# Patient Record
Sex: Male | Born: 1965 | Race: White | Hispanic: No | Marital: Single | Smoking: Former smoker
Health system: Southern US, Community
[De-identification: ages and names within clinical notes are randomized; demographics above are authoritative.]

## PROBLEM LIST (undated history)

## (undated) DIAGNOSIS — M549 Dorsalgia, unspecified: Secondary | ICD-10-CM

## (undated) DIAGNOSIS — E785 Hyperlipidemia, unspecified: Secondary | ICD-10-CM

## (undated) DIAGNOSIS — K449 Diaphragmatic hernia without obstruction or gangrene: Secondary | ICD-10-CM

## (undated) DIAGNOSIS — K219 Gastro-esophageal reflux disease without esophagitis: Secondary | ICD-10-CM

## (undated) DIAGNOSIS — E119 Type 2 diabetes mellitus without complications: Secondary | ICD-10-CM

## (undated) DIAGNOSIS — I10 Essential (primary) hypertension: Secondary | ICD-10-CM

## (undated) DIAGNOSIS — R0789 Other chest pain: Secondary | ICD-10-CM

## (undated) HISTORY — DX: Other chest pain: R07.89

## (undated) HISTORY — DX: Diaphragmatic hernia without obstruction or gangrene: K44.9

## (undated) HISTORY — DX: Hyperlipidemia, unspecified: E78.5

## (undated) HISTORY — DX: Essential (primary) hypertension: I10

## (undated) HISTORY — DX: Type 2 diabetes mellitus without complications: E11.9

## (undated) HISTORY — DX: Gastro-esophageal reflux disease without esophagitis: K21.9

## (undated) HISTORY — DX: Dorsalgia, unspecified: M54.9

---

## 2000-06-17 ENCOUNTER — Encounter: Payer: Self-pay | Admitting: Cardiothoracic Surgery

## 2000-06-17 ENCOUNTER — Encounter: Admission: RE | Admit: 2000-06-17 | Discharge: 2000-06-17 | Payer: Self-pay | Admitting: Cardiothoracic Surgery

## 2001-07-27 ENCOUNTER — Encounter: Payer: Self-pay | Admitting: Family Medicine

## 2001-07-27 ENCOUNTER — Ambulatory Visit (HOSPITAL_COMMUNITY): Admission: RE | Admit: 2001-07-27 | Discharge: 2001-07-27 | Payer: Self-pay | Admitting: Family Medicine

## 2002-09-20 ENCOUNTER — Encounter: Payer: Self-pay | Admitting: Family Medicine

## 2002-09-20 ENCOUNTER — Ambulatory Visit (HOSPITAL_COMMUNITY): Admission: RE | Admit: 2002-09-20 | Discharge: 2002-09-20 | Payer: Self-pay | Admitting: Family Medicine

## 2003-10-23 ENCOUNTER — Ambulatory Visit (HOSPITAL_COMMUNITY): Admission: RE | Admit: 2003-10-23 | Discharge: 2003-10-23 | Payer: Self-pay | Admitting: Family Medicine

## 2004-11-03 ENCOUNTER — Ambulatory Visit (HOSPITAL_COMMUNITY): Admission: RE | Admit: 2004-11-03 | Discharge: 2004-11-03 | Payer: Self-pay | Admitting: Family Medicine

## 2005-09-20 ENCOUNTER — Ambulatory Visit: Admission: RE | Admit: 2005-09-20 | Discharge: 2005-09-20 | Payer: Self-pay | Admitting: *Deleted

## 2005-09-30 ENCOUNTER — Ambulatory Visit: Payer: Self-pay | Admitting: Internal Medicine

## 2005-10-14 ENCOUNTER — Ambulatory Visit: Payer: Self-pay | Admitting: Pulmonary Disease

## 2005-11-03 ENCOUNTER — Ambulatory Visit (HOSPITAL_COMMUNITY): Admission: RE | Admit: 2005-11-03 | Discharge: 2005-11-03 | Payer: Self-pay | Admitting: Internal Medicine

## 2005-11-03 ENCOUNTER — Ambulatory Visit: Payer: Self-pay | Admitting: Internal Medicine

## 2005-11-03 HISTORY — PX: COLONOSCOPY: SHX174

## 2006-01-04 ENCOUNTER — Ambulatory Visit (HOSPITAL_COMMUNITY): Admission: RE | Admit: 2006-01-04 | Discharge: 2006-01-04 | Payer: Self-pay | Admitting: Family Medicine

## 2008-04-22 ENCOUNTER — Ambulatory Visit (HOSPITAL_COMMUNITY): Admission: RE | Admit: 2008-04-22 | Discharge: 2008-04-22 | Payer: Self-pay | Admitting: Family Medicine

## 2008-09-26 ENCOUNTER — Ambulatory Visit (HOSPITAL_COMMUNITY): Admission: RE | Admit: 2008-09-26 | Discharge: 2008-09-26 | Payer: Self-pay | Admitting: Family Medicine

## 2009-02-26 ENCOUNTER — Ambulatory Visit (HOSPITAL_COMMUNITY): Admission: RE | Admit: 2009-02-26 | Discharge: 2009-02-26 | Payer: Self-pay | Admitting: Family Medicine

## 2009-12-25 ENCOUNTER — Inpatient Hospital Stay (HOSPITAL_COMMUNITY): Admission: EM | Admit: 2009-12-25 | Discharge: 2009-12-26 | Payer: Self-pay | Admitting: Emergency Medicine

## 2009-12-27 ENCOUNTER — Emergency Department (HOSPITAL_COMMUNITY): Admission: EM | Admit: 2009-12-27 | Discharge: 2009-12-27 | Payer: Self-pay | Admitting: Emergency Medicine

## 2010-01-30 HISTORY — PX: US ECHOCARDIOGRAPHY: HXRAD669

## 2010-01-30 HISTORY — PX: NM MYOCAR PERF WALL MOTION: HXRAD629

## 2010-07-24 ENCOUNTER — Ambulatory Visit (HOSPITAL_COMMUNITY): Admission: RE | Admit: 2010-07-24 | Discharge: 2010-07-24 | Payer: Self-pay | Admitting: Family Medicine

## 2011-01-17 ENCOUNTER — Encounter: Payer: Self-pay | Admitting: Family Medicine

## 2011-03-29 LAB — DIFFERENTIAL
Basophils Relative: 1 % (ref 0–1)
Basophils Relative: 1 % (ref 0–1)
Eosinophils Absolute: 0.1 10*3/uL (ref 0.0–0.7)
Eosinophils Relative: 1 % (ref 0–5)
Lymphocytes Relative: 14 % (ref 12–46)
Lymphocytes Relative: 20 % (ref 12–46)
Lymphs Abs: 1.7 10*3/uL (ref 0.7–4.0)
Lymphs Abs: 2 10*3/uL (ref 0.7–4.0)
Monocytes Relative: 5 % (ref 3–12)
Neutro Abs: 7.4 10*3/uL (ref 1.7–7.7)
Neutrophils Relative %: 72 % (ref 43–77)
Neutrophils Relative %: 80 % — ABNORMAL HIGH (ref 43–77)

## 2011-03-29 LAB — BASIC METABOLIC PANEL
BUN: 9 mg/dL (ref 6–23)
CO2: 26 mEq/L (ref 19–32)
Calcium: 9.7 mg/dL (ref 8.4–10.5)
Chloride: 103 mEq/L (ref 96–112)
Chloride: 98 mEq/L (ref 96–112)
Creatinine, Ser: 0.71 mg/dL (ref 0.4–1.5)
GFR calc Af Amer: >60 mL/min (ref >60)
GFR calc Af Amer: >60 mL/min (ref >60)
GFR calc non Af Amer: >60 mL/min (ref >60)
Glucose, Bld: 137 mg/dL — ABNORMAL HIGH (ref 70–99)
Potassium: 4 mEq/L (ref 3.5–5.1)
Sodium: 136 mEq/L (ref 135–145)
Sodium: 137 mEq/L (ref 135–145)

## 2011-03-29 LAB — POCT CARDIAC MARKERS
CKMB, poc: 1.1 ng/mL (ref 1.0–8.0)
Myoglobin, poc: 62.5 ng/mL (ref 12–200)
Troponin i, poc: <0.05 ng/mL (ref 0.00–0.09)

## 2011-03-29 LAB — CARDIAC PANEL(CRET KIN+CKTOT+MB+TROPI)
Total CK: 132 U/L (ref 7–232)
Troponin I: 0.03 ng/mL (ref 0.00–0.06)

## 2011-03-29 LAB — CBC
HCT: 44.1 % (ref 39.0–52.0)
MCHC: 34.4 g/dL (ref 30.0–36.0)
MCV: 89.6 fL (ref 78.0–100.0)
RBC: 4.92 MIL/uL (ref 4.22–5.81)
WBC: 10.2 10*3/uL (ref 4.0–10.5)

## 2011-03-29 LAB — TSH: TSH: 1.506 u[IU]/mL (ref 0.350–4.500)

## 2011-05-14 NOTE — Op Note (Signed)
NAMEDIGBY, GROENEVELD               ACCOUNT NO.:  [id number]   MEDICAL RECORD NO.:  [id number]          PATIENT TYPE:  AMB   LOCATION:  DAY                           FACILITY:  [facility]   PHYSICIAN:  R. Roetta Sessions, M.D. DATE OF BIRTH:  [birth year]   DATE OF PROCEDURE:  11/03/2005  DATE OF DISCHARGE:                                 OPERATIVE REPORT   PROCEDURE:  Colonoscopy with ileoscopy, stool sampling.   INDICATIONS FOR PROCEDURE:  The patient is a 45 year old Caucasian male with  intermittent post prandial abdominal cramps who had diarrhea for the past  four months. He developed what sounds like a food-borne illness some four  months ago. He has had persisting symptoms. He has two second degree  relatives with colorectal carcinoma but no first degree relatives. He has  not passed any ______________. Colonoscopy is now being done. This approach  has been discussed with the patient at length. Potential risks, benefits,  and alternatives have been reviewed and questions answered. She is  agreeable. Please see documentation in the medical record.   PROCEDURE NOTE:  O2 saturation, blood pressure, pulse, and respirations were  monitored throughout the entire procedure. Conscious sedation with Versed 4  mg IV and Demerol 75 mg IV in divided doses.   INSTRUMENT:  Olympus video chip system.   FINDINGS:  Digital rectal exam revealed no abnormalities.   ENDOSCOPIC FINDINGS:  Prep was adequate.   Rectum:  Examination of the rectal mucosa including retroflexed view of the  anal verge revealed no abnormalities.   Colon:  Colonic mucosa was surveyed from the rectosigmoid junction through  the left, transverse, and right colon to the area of the appendiceal  orifice, ileocecal valve, and cecum. These structures were well seen and  photographed for the record. Terminal ileum was intubated to 10 cm. From  this level, the scope was slowly withdrawn, and all previously mentioned  mucosal surfaces  were again seen. The terminal ileal and colonic mucosa  appeared entirely normal. Stool sample was suctioned out for microbiology  studies. The patient tolerated the procedure well and was reactive to  endoscopy.   IMPRESSION:  Normal rectum, colon, terminal ileum.   I suspect the patient has irritable bowel syndrome, possibly exacerbated or  unmasked with a recent food-borne illness.   RECOMMENDATIONS:  1.  IBS literature provided to [name].  2.  NuLev 1 tablet on tongue before meals as needed.  3.  Follow up on stool studies.  4.  Further recommendations to follow.      Stephen Alexander, M.D.  Electronically Signed     RMR/MEDQ  D:  11/03/2005  T:  11/03/2005  Job:  [id number]   cc:   Madelin Rear. Sherwood Gambler, MD  Fax: [phone]

## 2011-05-14 NOTE — H&P (Signed)
NAMECORDELLE, DAHMEN               ACCOUNT NO.:  [id number]   MEDICAL RECORD NO.:  [id number]          PATIENT TYPE:  AMB   LOCATION:  DAY                           FACILITY:  [facility]   PHYSICIAN:  R. Roetta Sessions, M.D. DATE OF BIRTH:  1966-02-05   DATE OF ADMISSION:  DATE OF DISCHARGE:  LH                                HISTORY & PHYSICAL   CHIEF COMPLAINT:  Altered bowel habits X3 months.  Positive family history  of colon cancer.   HISTORY OF PRESENT ILLNESS:  Mr. Conner Muegge is a 45 year old Caucasian  male who came to see me to discuss further his abdominal cramps, need for  colonoscopy.   He says he developed acute gastroenteritis which was characterized by  nausea, vomiting, diarrhea, abdominal pain back in July.  He saw Dr. Sherwood Gambler  who felt he had a food-borne illness and was Hemoccult negative per patient  report at that time.  He has had intermittent postprandial abdominal cramps  and diarrhea every since. He occasionally has a formed bowel movement. No  blood per rectum or melena.  He has not lost any weight.  He is concerned by  the fact that two maternal uncles and his maternal grandmother had  colorectal cancer.  He has no first degree relatives with colorectal cancer.   I saw this gentleman back in 1999 for typical gastroesophageal reflux  disease and episode of rectal bleeding.  He underwent an  esophagogastroduodenoscopy and colonoscopy back on April 01, 1998 which  demonstrated no upper gastrointestinal tract abnormalities.  His  sigmoidoscopy was carried out to 50 cm and demonstrated nothing more than  anal canal hemorrhoids.  There is no family history of inflammatory bowel  disease. He has had some atypical chest pain over the past couple of months  which has gotten better over the past one month. He has seen Dr. Domingo Sep  and evaluation has included a stress test, echocardiogram. He had a sleep  test recently and was told he may have sleep apnea.  He also  reports picking  up four stray cats recently, [place] and they had  parasites.   PAST MEDICAL HISTORY:  Chronic discogenic back pain.  Hypertension.  History  of atypical chest pain as above.   PAST SURGICAL HISTORY:  No prior surgeries, aside from endoscopic evaluation  as outlined above.   CURRENT MEDICATIONS:  1.  Lorcet Plus 7.5/650 mg once daily.  2.  Flexeril 10 mg daily.  3.  Tylenol PM PRN.  4.  Zantac 150 mg three to four times weekly as a preventative measure for      gastroesophageal reflux disease.   ALLERGIES:  No known drug allergies.   FAMILY HISTORY:  As outlined above.  Father died with aspiration pneumonia  and stroke last year.  Mother is alive in fair health.   SOCIAL HISTORY:  Patient is single. He has no children.  He is employed with  [employer].  He makes [employer].  He was a former smoker but none  since 2001.  Prior heavy use but  none since 1998.  No illicit drug use.   REVIEW OF SYSTEMS:  As in history of present illness.  He has rare typical  reflux symptoms.  No odynophagia, dysphagia, early satiety, nausea,  vomiting, no melena or rectal bleeding.   PHYSICAL EXAMINATION:  GENERAL APPEARANCE:  Physical examination reveals a  pleasant 45 year old gentleman resting comfort.  VITAL SIGNS:  Weight 261.5, height 5 feet 10 inches, temperature 97.5, blood  pressure 134/80, pulse 88.  SKIN:  Warm and dry.  HEENT:  No scleral icterus.  Conjunctiva pink.  Oral cavity with no lesions.  NECK:  Jugular venous distention is not prominent.  CHEST:  Lungs are clear to auscultation.  CARDIAC:  Regular rate and rhythm without murmurs, rubs or gallops.  ABDOMEN:  Obese, positive bowel sounds, soft, nontender without appreciable  mass or organomegaly.   ASSESSMENT:  Mr. Oshae Simmering is a 45 year old gentleman with positive  family history of colon cancer in multiple second degree relatives who has  had intermittent postprandial abdominal  cramps and diarrhea since suffering  what sounds like an acute bout of gastroenteritis back some three months  ago.  He is quite concerned about colorectal cancer given his family  history.  We had a lengthy discussion about this.  He wants to have his  colon examined.  This is not unreasonable given he does have altered bowel  habits and multiple second degree relatives with colon cancer. He has  occasional reflux symptoms but certain nothing that sends up any red flags  to me.  Prior esophagogastroduodenoscopy in 1999 is reassuring. He take  Zantac more prophylactically than for actual symptomatic reflux disease.   RECOMMENDATIONS:  I have offered Mr. Kristofor Michalowski a colonoscopy.  The  potential risks, benefits and alternatives have been discussed and questions  answered.  He is agreeable. Will plan to perform colonoscopy in the very  near future and will make further recommendations after this examination has  been performed.      Jonathon Bellows, M.D.  Electronically Signed     RMR/MEDQ  D:  09/30/2005  T:  09/30/2005  Job:  [id number]   cc:   Madelin Rear. Sherwood Gambler, MD  Fax: [phone]

## 2011-05-14 NOTE — Procedures (Signed)
[name], Stephen Alexander NO.:  [id number]   MEDICAL RECORD NO.:  [id number]          PATIENT TYPE:  OUT   LOCATION:  SLEEP LAB                     FACILITY:  [facility]   PHYSICIAN:  Marcelyn Bruins, M.D. [facility] DATE OF BIRTH:  27-Nov-1966   DATE OF STUDY:  09/20/2005                              NOCTURNAL POLYSOMNOGRAM   REFERRING PHYSICIAN:  Dr. Kem Boroughs   INDICATION FOR THE STUDY:  Persistent disorder of initiating and maintaining  wakefulness.   EPWORTH SCORE:  11   SLEEP ARCHITECTURE:  The patient had a total sleep time of 330 minutes with  significantly decreased slow wave sleep and REM. Sleep onset latency was  very prolonged at 74 minutes, as was REM onset at 188 minutes. Sleep  efficiency was decreased to 82%.   RESPIRATORY DATA:  The patient was found to have small numbers of  obstructive apneas and hypopneas for a respiratory disturbance index of  eight events per hour. The events were not positional but were associated  with moderate to loud snoring.   OXYGEN DATA:  The patient was found to have O2 desaturation as low as 86%  with obstructive events.   CARDIAC DATA:  There were no clinically significant cardiac arrhythmias.   MOVEMENT/PARASOMNIAS:  The patient was found to have small numbers of leg  jerks with minimal sleep disruption.   IMPRESSION/RECOMMENDATION:  1.  Very mild obstructive sleep apnea/hypopnea syndrome with a respiratory      disturbance index of eight events per hour and oxygen desaturation as      low as 86%. Treatment for this degree of sleep apnea if clinically      indicated could include upper airway surgery, weight loss alone if      appropriate, oral appliance, and CPAP.  2.  It should be noted the patient had a very prolonged sleep onset and      achieved very little REM or slow wave      sleep. The patient stated on his pre-study questionnaire that he took a      long nap prior to the study. Perhaps the patient's sleep  hygiene is      playing a role in his significant sleep symptomatology.                                            ______________________________  Marcelyn Bruins, M.D. [facility]  Diplomate, American Board of Sleep  Medicine     KC/MEDQ  D:  10/12/2005 09:04:35  T:  10/12/2005 09:32:45  Job:  [id number]

## 2011-10-12 IMAGING — US US EXTREM LOW VENOUS*L*
1 series · 14 of 24 positions shown · non-contrast
Comparison: None

CLINICAL DATA: Left lower extremity pain and swelling.

LEFT LOWER EXTREMITY VENOUS DUPLEX ULTRASOUND
TECHNIQUE: Gray-scale sonography with graded compression, as well
as color Doppler and duplex ultrasound, were performed to evaluate
the deep venous system of the lower extremity from the level of the
common femoral vein through the popliteal and proximal calf veins.
Spectral Doppler was utilized to evaluate flow at rest and with
distal augmentation maneuvers.

[Series 1: us extrem low venous*left* · 14 of 55 slices shown]
[im 1/55]
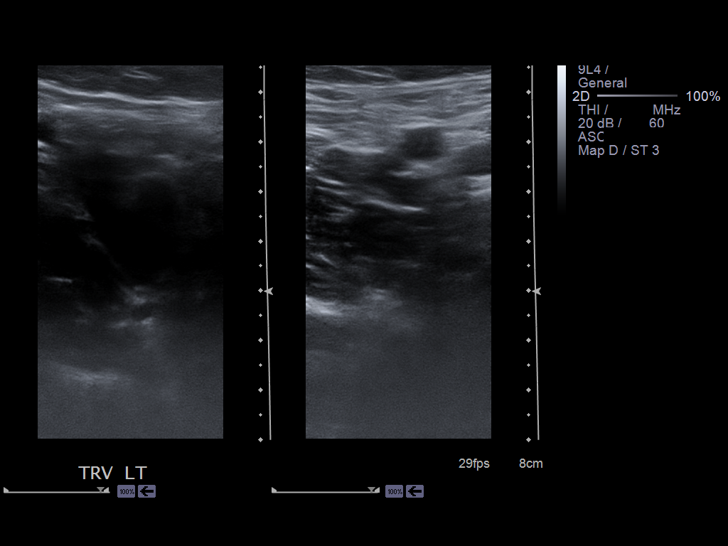
[im 5/55]
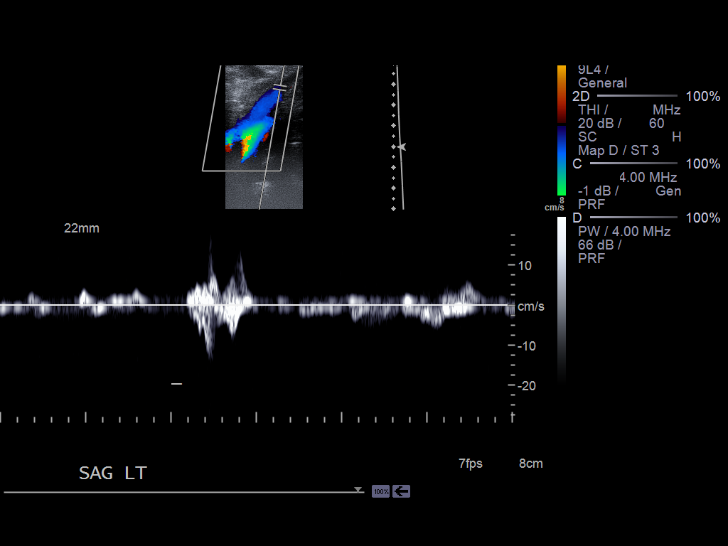
[im 10/55]
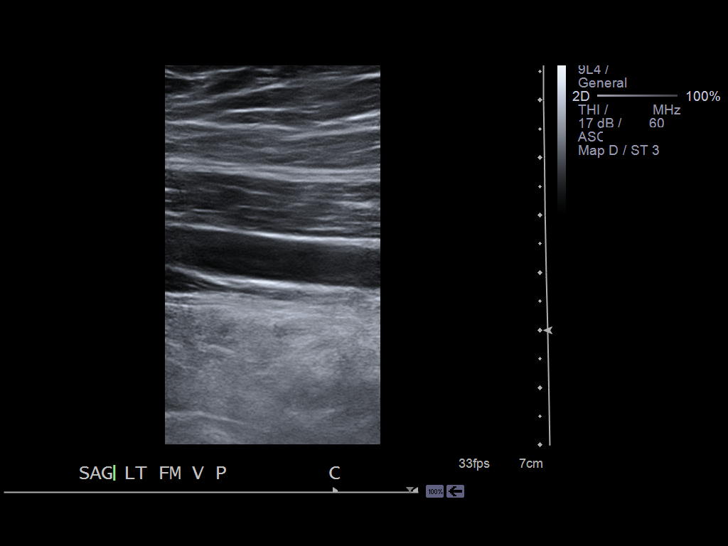
[im 15/55]
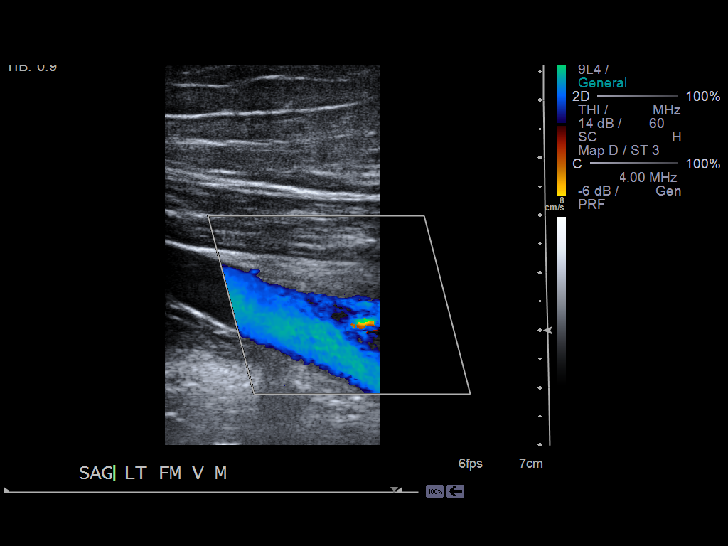
[im 17/55]
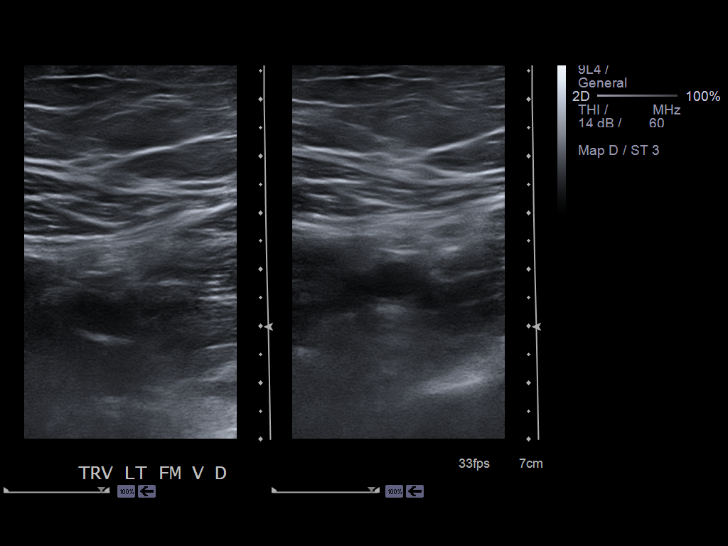
[im 22/55]
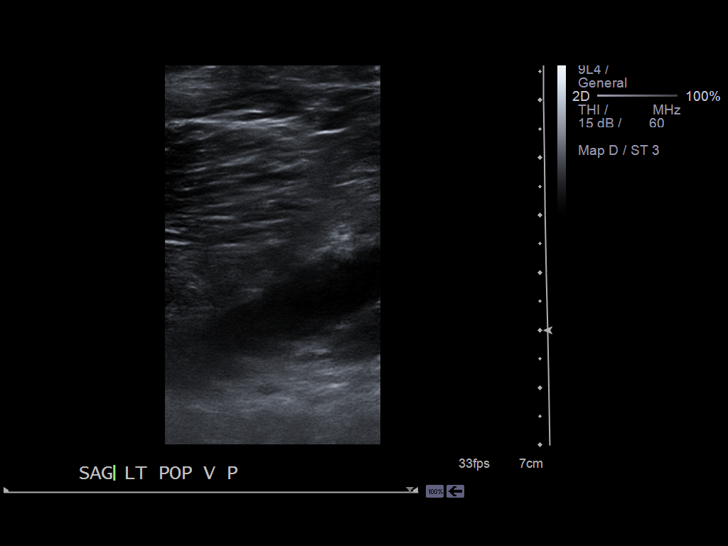
[im 26/55]
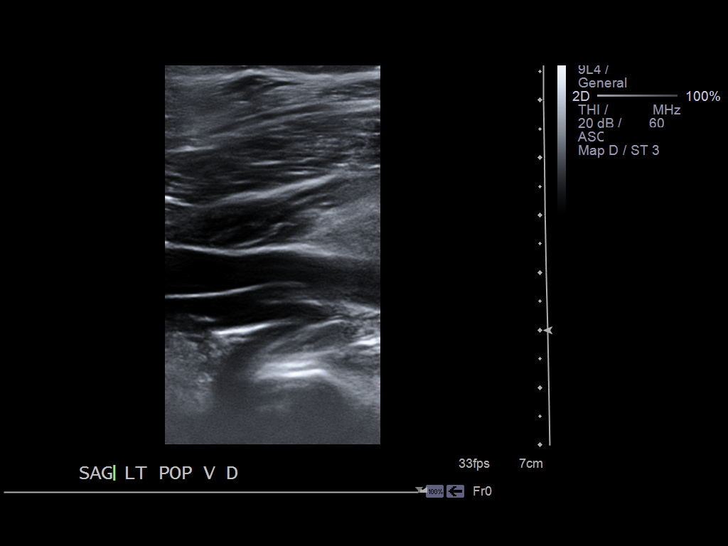
[im 29/55]
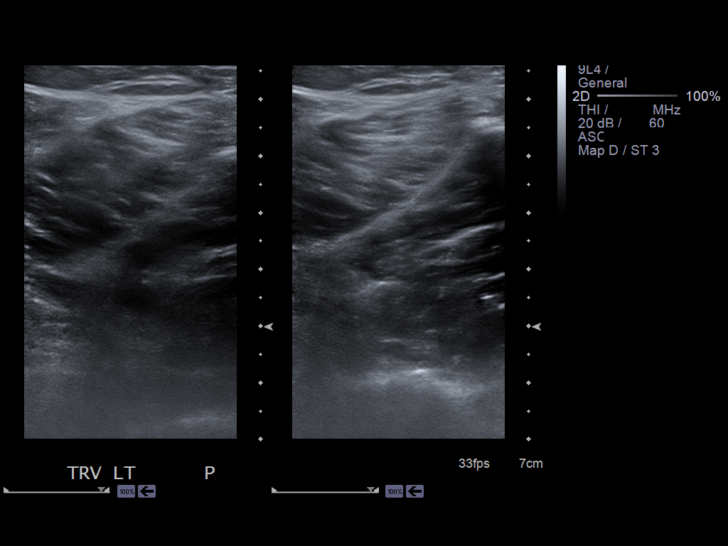
[im 33/55]
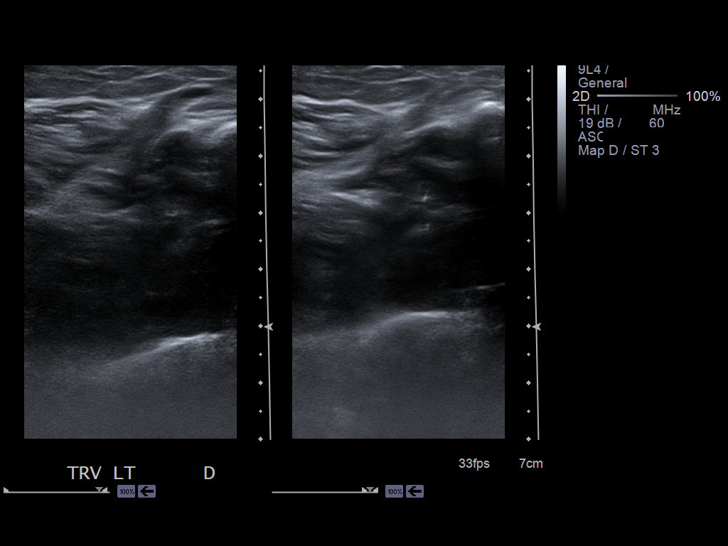
[im 38/55]
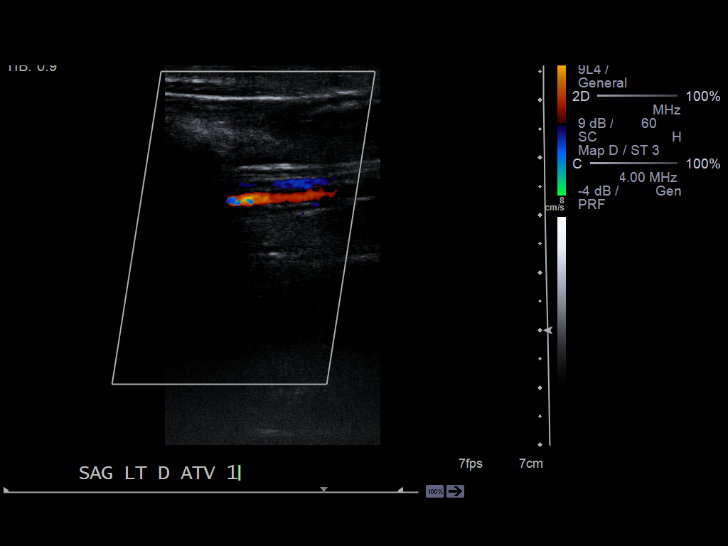
[im 43/55]
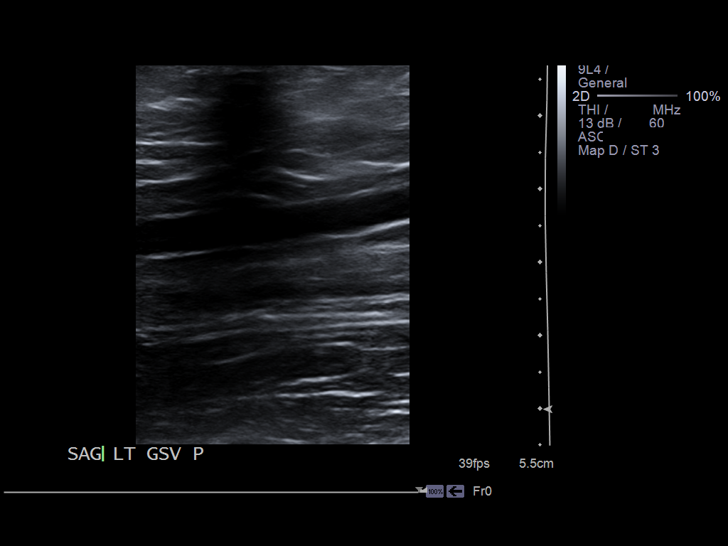
[im 45/55]
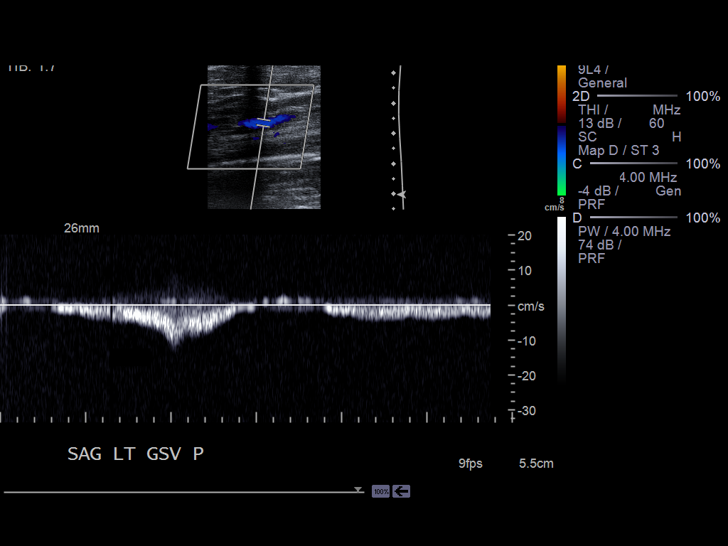
[im 50/55]
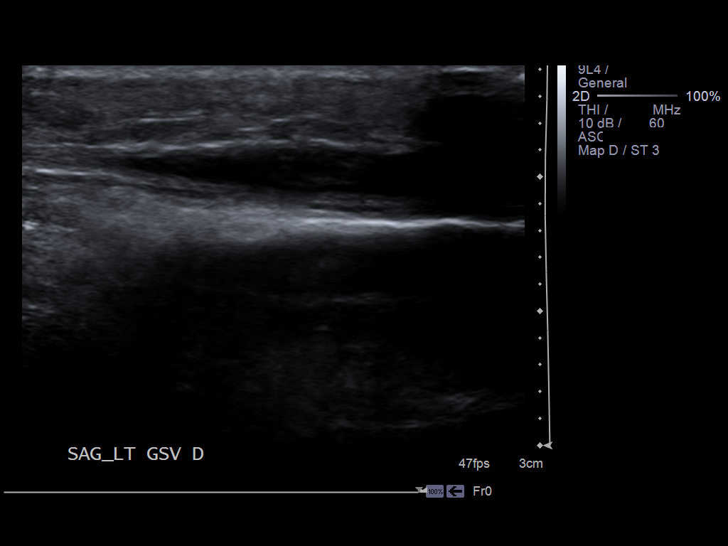
[im 55/55]
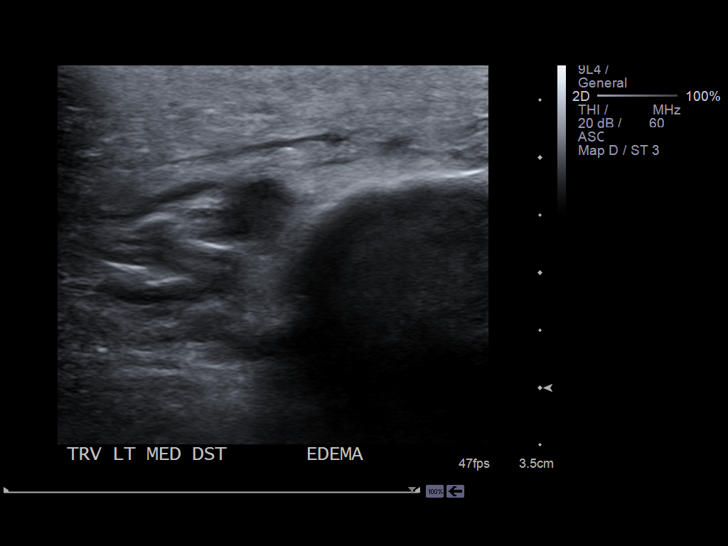

[14 of 24 positions shown; findings below may reference images not displayed]

FINDINGS: There is patent flow in the common femoral, profunda
femoral, superficial femoral and popliteal veins.  These vessels
were completely compressible and demonstrate increased flow with
augmentation.
IMPRESSION: Negative venous Doppler examination for deep venous thrombosis in
the left lower extremity.

## 2011-12-28 DIAGNOSIS — K449 Diaphragmatic hernia without obstruction or gangrene: Secondary | ICD-10-CM

## 2011-12-28 HISTORY — DX: Diaphragmatic hernia without obstruction or gangrene: K44.9

## 2012-08-23 ENCOUNTER — Other Ambulatory Visit (HOSPITAL_COMMUNITY): Payer: Self-pay | Admitting: Cardiovascular Disease

## 2012-08-23 DIAGNOSIS — R109 Unspecified abdominal pain: Secondary | ICD-10-CM

## 2012-08-25 ENCOUNTER — Ambulatory Visit (HOSPITAL_COMMUNITY)
Admission: RE | Admit: 2012-08-25 | Discharge: 2012-08-25 | Disposition: A | Discharge status: Home / Self Care | Payer: 59 | Source: Ambulatory Visit | Attending: Cardiovascular Disease | Admitting: Cardiovascular Disease

## 2012-08-25 DIAGNOSIS — E119 Type 2 diabetes mellitus without complications: Secondary | ICD-10-CM | POA: Insufficient documentation

## 2012-08-25 DIAGNOSIS — I10 Essential (primary) hypertension: Secondary | ICD-10-CM | POA: Insufficient documentation

## 2012-08-25 DIAGNOSIS — R109 Unspecified abdominal pain: Secondary | ICD-10-CM

## 2012-08-29 ENCOUNTER — Telehealth: Payer: Self-pay | Admitting: Internal Medicine

## 2012-08-29 NOTE — Telephone Encounter (Signed)
Pt called to make OV with RMR because he has been passing bright red blood. He is flying out of town in the morning and coming back Sunday. I told him if things got worse that he should seek care at the ER or Urgent center. He agreed that he would and he would keep OV on 9/10 @ 1130 with RMR

## 2012-09-04 ENCOUNTER — Encounter: Payer: Self-pay | Admitting: Internal Medicine

## 2012-09-05 ENCOUNTER — Encounter: Payer: Self-pay | Admitting: Internal Medicine

## 2012-09-05 ENCOUNTER — Ambulatory Visit (INDEPENDENT_AMBULATORY_CARE_PROVIDER_SITE_OTHER): Payer: 59 | Admitting: Internal Medicine

## 2012-09-05 VITALS — BP 118/73 | HR 81 | Temp 98.0°F | Ht 70.0 in | Wt 288.6 lb

## 2012-09-05 DIAGNOSIS — K219 Gastro-esophageal reflux disease without esophagitis: Secondary | ICD-10-CM | POA: Insufficient documentation

## 2012-09-05 DIAGNOSIS — K921 Melena: Secondary | ICD-10-CM | POA: Insufficient documentation

## 2012-09-05 MED ORDER — PEG-KCL-NACL-NASULF-NA ASC-C 100 G PO SOLR: 1.0000 | ORAL | Status: DC

## 2012-09-05 NOTE — Progress Notes (Signed)
Primary Care Physician:  GOLDING,JOHN CABOT, MD Primary Gastroenterologist:  Dr. Dimitrios Balestrieri  Pre-Procedure History & Physical: HPI:  Stephen Alexander is a 46 y.o. obese male here for further evaluation of rectal bleeding appears noted some intermittent gross blood per rectum over the past  2-3 months. Denies straining. No constipation or diarrhea. Last colonoscopy 2007. Normal rectum and colon. Multiple second-degree relatives with colon cancer; mother has had colonic polyps. Patient has gained  40 pounds over the past couple of years. Worsening reflux symptoms. No dysphagia or odynophagia. Has been taking Zegerid sporadically; more recently on a regular basis.  He continues to have symptoms. He often takes TUMS on top of Zegerid. Takes a lot of caffeine;  takes 5 hour energy 2-3 times daily-  sometimes large quantities of caffeine in iced tea and other soft drinks. Patient reports an EGD in 1999 without significant findings. Those records are unavailable at this time.  Past Medical History  Diagnosis Date  . Back pain     Chronic discogenic   . Hypertension   . Atypical chest pain     history    Past Surgical History  Procedure Date  . Colonoscopy   11/03/2005    Normal rectum, colon, terminal ileum.    Prior to Admission medications   Medication Sig Start Date End Date Taking? Authorizing Provider  ALPRAZolam (XANAX) 0.5 MG tablet Take 0.5 mg by mouth at bedtime as needed.   Yes Historical Provider, MD  aspirin 81 MG tablet Take 81 mg by mouth daily.   Yes Historical Provider, MD  Chromium 400 MCG TABS Take 400 mg by mouth daily.   Yes Historical Provider, MD  Cinnamon 500 MG TABS Take 500 mg by mouth daily.   Yes Historical Provider, MD  Coenzyme Q10 (CO Q-10) 300 MG CAPS Take 300 mg by mouth daily.   Yes Historical Provider, MD  cyclobenzaprine (FLEXERIL) 10 MG tablet Take 10 mg by mouth 3 (three) times daily as needed.   Yes Historical Provider, MD  ergocalciferol (VITAMIN D2) 50000 UNITS  capsule Take 50,000 Units by mouth once a week.   Yes Historical Provider, MD  HYDROcodone-acetaminophen (LORCET) 10-650 MG per tablet Take 1 tablet by mouth every 6 (six) hours as needed.   Yes Historical Provider, MD  ibuprofen (ADVIL,MOTRIN) 200 MG tablet Take 200 mg by mouth every 6 (six) hours as needed.   Yes Historical Provider, MD  losartan (COZAAR) 50 MG tablet Take 50 mg by mouth daily.   Yes Historical Provider, MD  nebivolol (BYSTOLIC) 5 MG tablet Take 5 mg by mouth daily.   Yes Historical Provider, MD  OMEGA-3 KRILL OIL 300 MG CAPS Take by mouth daily.   Yes Historical Provider, MD  omeprazole-sodium bicarbonate (ZEGERID) 40-1100 MG per capsule Take 1 capsule by mouth daily before breakfast.   Yes Historical Provider, MD  Red Yeast Rice 600 MG TABS Take 600 mg by mouth 2 (two) times daily.   Yes Historical Provider, MD  UNABLE TO FIND Cinsulin 2 daily   Yes Historical Provider, MD    Allergies as of 09/05/2012  . (No Known Allergies)    No family history on file.  History   Social History  . Marital Status: Single    Spouse Name: N/A    Number of Children: N/A  . Years of Education: N/A   Occupational History  . Not on file.   Social History Main Topics  . Smoking status: Never Smoker   .   Smokeless tobacco: Not on file  . Alcohol Use: No  . Drug Use: No  . Sexually Active: Not on file   Other Topics Concern  . Not on file   Social History Narrative  . No narrative on file    Review of Systems: See HPI, otherwise negative ROS  Physical Exam: BP 118/73  Pulse 81  Temp 98 F (36.7 C) (Temporal)  Ht 5' 10" (1.778 m)  Wt 288 lb 9.6 oz (130.908 kg)  BMI 41.41 kg/m2 General:   Alert,  Well-developed, well-nourished, obese pleasant and cooperative in NAD Skin:  Intact without significant lesions or rashes. Eyes:  Sclera clear, no icterus.   Conjunctiva pink. Ears:  Normal auditory acuity. Nose:  No deformity, discharge,  or lesions. Mouth:  No deformity  or lesions. Neck:  Supple; no masses or thyromegaly. No significant cervical adenopathy. Lungs:  Clear throughout to auscultation.   No wheezes, crackles, or rhonchi. No acute distress. Heart:  Regular rate and rhythm; no murmurs, clicks, rubs,  or gallops. Abdomen: Obese Non-distended, normal bowel sounds.  Soft and nontender without appreciable mass or hepatosplenomegaly.  Pulses:  Normal pulses noted. Extremities:  Without clubbing or edema.  Impression/Plan:  Pleasant 46-year-old obese gentleman with intermittent rectal bleeding. I suspect a benign anorectal lesion however, it's been a number of years since he last had his lower GI tract evaluated. In addition, his has refractory reflux symptoms in the setting of excessive daily caffeine use and significant, progressive obesity. Proton pump inhibitor therapy has been inadequate  controlling his reflux symptoms.  Recommendations: Diagnostic EGD and colonoscopy in the near future at the hospital.The risks, benefits, limitations, imponderables and alternatives regarding both EGD and colonoscopy have been reviewed with the patient. Questions have been answered. All parties agreeable.  Weight loss recommended. Patient is to significantly curtail his use of caffeine-containing products. Literature on GERD provided to the patient. Further recommendations to follow endoscopic evaluation. 

## 2012-09-05 NOTE — Patient Instructions (Signed)
Diagnostic EGD and Colonoscopy (GERD and rectal bleeding)

## 2012-09-06 ENCOUNTER — Encounter (HOSPITAL_COMMUNITY): Payer: Self-pay | Admitting: Pharmacy Technician

## 2012-09-08 ENCOUNTER — Encounter (HOSPITAL_COMMUNITY): Payer: Self-pay | Admitting: *Deleted

## 2012-09-08 ENCOUNTER — Ambulatory Visit (HOSPITAL_COMMUNITY)
Admission: RE | Admit: 2012-09-08 | Discharge: 2012-09-08 | Disposition: A | Discharge status: Home / Self Care | Payer: 59 | Source: Ambulatory Visit | Attending: Internal Medicine | Admitting: Internal Medicine

## 2012-09-08 ENCOUNTER — Encounter (HOSPITAL_COMMUNITY)
Admission: RE | Disposition: A | Discharge status: Home / Self Care | Payer: Self-pay | Source: Ambulatory Visit | Attending: Internal Medicine

## 2012-09-08 DIAGNOSIS — K219 Gastro-esophageal reflux disease without esophagitis: Secondary | ICD-10-CM

## 2012-09-08 DIAGNOSIS — K449 Diaphragmatic hernia without obstruction or gangrene: Secondary | ICD-10-CM | POA: Insufficient documentation

## 2012-09-08 DIAGNOSIS — K921 Melena: Secondary | ICD-10-CM | POA: Insufficient documentation

## 2012-09-08 DIAGNOSIS — I10 Essential (primary) hypertension: Secondary | ICD-10-CM | POA: Insufficient documentation

## 2012-09-08 DIAGNOSIS — K2100 Gastro-esophageal reflux disease with esophagitis, without bleeding: Secondary | ICD-10-CM | POA: Insufficient documentation

## 2012-09-08 HISTORY — PX: COLONOSCOPY: SHX174

## 2012-09-08 HISTORY — PX: ESOPHAGOGASTRODUODENOSCOPY: SHX1529

## 2012-09-08 SURGERY — COLONOSCOPY WITH ESOPHAGOGASTRODUODENOSCOPY (EGD)
Anesthesia: Moderate Sedation

## 2012-09-08 MED ORDER — BUTAMBEN-TETRACAINE-BENZOCAINE 2-2-14 % EX AERO
INHALATION_SPRAY | CUTANEOUS | Status: DC | PRN
  Administered 2012-09-08: 2 via TOPICAL

## 2012-09-08 MED ORDER — MEPERIDINE HCL 100 MG/ML IJ SOLN
INTRAMUSCULAR | Status: DC | PRN
  Administered 2012-09-08: 25 mg via INTRAVENOUS
  Administered 2012-09-08: 50 mg via INTRAVENOUS
  Administered 2012-09-08: 25 mg via INTRAVENOUS
  Administered 2012-09-08 (×2): 50 mg via INTRAVENOUS
  Administered 2012-09-08: 25 mg via INTRAVENOUS

## 2012-09-08 MED ORDER — STERILE WATER FOR IRRIGATION IR SOLN
Status: DC | PRN
  Administered 2012-09-08: 13:00:00

## 2012-09-08 MED ORDER — MIDAZOLAM HCL 5 MG/5ML IJ SOLN
INTRAMUSCULAR | Status: DC | PRN
  Administered 2012-09-08 (×3): 1 mg via INTRAVENOUS
  Administered 2012-09-08 (×3): 2 mg via INTRAVENOUS
  Administered 2012-09-08: 1 mg via INTRAVENOUS

## 2012-09-08 MED ORDER — MIDAZOLAM HCL 5 MG/5ML IJ SOLN
INTRAMUSCULAR | Status: AC
  Filled 2012-09-08: qty 10

## 2012-09-08 MED ORDER — SODIUM CHLORIDE 0.45 % IV SOLN
INTRAVENOUS | Status: DC
  Administered 2012-09-08: 20 mL/h via INTRAVENOUS

## 2012-09-08 MED ORDER — MEPERIDINE HCL 50 MG/ML IJ SOLN
INTRAMUSCULAR | Status: AC
  Filled 2012-09-08: qty 1

## 2012-09-08 MED ORDER — MEPERIDINE HCL 100 MG/ML IJ SOLN
INTRAMUSCULAR | Status: AC
  Filled 2012-09-08: qty 2

## 2012-09-08 NOTE — Interval H&P Note (Signed)
History and Physical Interval Note:  09/08/2012 1:13 PM  Stephen Alexander  has presented today for surgery, with the diagnosis of Hematochezia and GERD  The various methods of treatment have been discussed with the patient and family. After consideration of risks, benefits and other options for treatment, the patient has consented to  Procedure(s) (LRB) with comments: COLONOSCOPY WITH ESOPHAGOGASTRODUODENOSCOPY (EGD) (N/A) - 1:00 as a surgical intervention .  The patient's history has been reviewed, patient examined, no change in status, stable for surgery.  I have reviewed the patient's chart and labs.  Questions were answered to the patient's satisfaction.     Eula Listen

## 2012-09-08 NOTE — H&P (View-Only) (Signed)
Primary Care Physician:  Colette Ribas, MD Primary Gastroenterologist:  Dr. [name]  Pre-Procedure History & Physical: HPI:  Stephen Alexander is a 46 y.o. obese male here for further evaluation of rectal bleeding appears noted some intermittent gross blood per rectum over the past  2-3 months. Denies straining. No constipation or diarrhea. Last colonoscopy 2007. Normal rectum and colon. Multiple second-degree relatives with colon cancer; mother has had colonic polyps. Patient has gained  40 pounds over the past couple of years. Worsening reflux symptoms. No dysphagia or odynophagia. Has been taking Zegerid sporadically; more recently on a regular basis.  He continues to have symptoms. He often takes TUMS on top of Zegerid. Takes a lot of caffeine;  takes 5 hour energy 2-3 times daily-  sometimes large quantities of caffeine in iced tea and other soft drinks. Patient reports an EGD in 1999 without significant findings. Those records are unavailable at this time.  Past Medical History  Diagnosis Date  . Back pain     Chronic discogenic   . Hypertension   . Atypical chest pain     history    Past Surgical History  Procedure Date  . Colonoscopy   11/03/2005    Normal rectum, colon, terminal ileum.    Prior to Admission medications   Medication Sig Start Date End Date Taking? Authorizing Provider  ALPRAZolam Prudy Feeler) 0.5 MG tablet Take 0.5 mg by mouth at bedtime as needed.   Yes Historical Provider, MD  aspirin 81 MG tablet Take 81 mg by mouth daily.   Yes Historical Provider, MD  Chromium 400 MCG TABS Take 400 mg by mouth daily.   Yes Historical Provider, MD  Cinnamon 500 MG TABS Take 500 mg by mouth daily.   Yes Historical Provider, MD  Coenzyme Q10 (CO Q-10) 300 MG CAPS Take 300 mg by mouth daily.   Yes Historical Provider, MD  cyclobenzaprine (FLEXERIL) 10 MG tablet Take 10 mg by mouth 3 (three) times daily as needed.   Yes Historical Provider, MD  ergocalciferol (VITAMIN D2) 50000 UNITS  capsule Take 50,000 Units by mouth once a week.   Yes Historical Provider, MD  HYDROcodone-acetaminophen (LORCET) 10-650 MG per tablet Take 1 tablet by mouth every 6 (six) hours as needed.   Yes Historical Provider, MD  ibuprofen (ADVIL,MOTRIN) 200 MG tablet Take 200 mg by mouth every 6 (six) hours as needed.   Yes Historical Provider, MD  losartan (COZAAR) 50 MG tablet Take 50 mg by mouth daily.   Yes Historical Provider, MD  nebivolol (BYSTOLIC) 5 MG tablet Take 5 mg by mouth daily.   Yes Historical Provider, MD  OMEGA-3 KRILL OIL 300 MG CAPS Take by mouth daily.   Yes Historical Provider, MD  omeprazole-sodium bicarbonate (ZEGERID) 40-1100 MG per capsule Take 1 capsule by mouth daily before breakfast.   Yes Historical Provider, MD  Red Yeast Rice 600 MG TABS Take 600 mg by mouth 2 (two) times daily.   Yes Historical Provider, MD  UNABLE TO FIND Cinsulin 2 daily   Yes Historical Provider, MD    Allergies as of 09/05/2012  . (No Known Allergies)    No family history on file.  History   Social History  . Marital Status: Single    Spouse Name: N/A    Number of Children: N/A  . Years of Education: N/A   Occupational History  . Not on file.   Social History Main Topics  . Smoking status: Never Smoker   .  Smokeless tobacco: Not on file  . Alcohol Use: No  . Drug Use: No  . Sexually Active: Not on file   Other Topics Concern  . Not on file   Social History Narrative  . No narrative on file    Review of Systems: See HPI, otherwise negative ROS  Physical Exam: BP 118/73  Pulse 81  Temp 98 F (36.7 C) (Temporal)  Ht 5\' 10"  (1.778 m)  Wt 288 lb 9.6 oz (130.908 kg)  BMI 41.41 kg/m2 General:   Alert,  Well-developed, well-nourished, obese pleasant and cooperative in NAD Skin:  Intact without significant lesions or rashes. Eyes:  Sclera clear, no icterus.   Conjunctiva pink. Ears:  Normal auditory acuity. Nose:  No deformity, discharge,  or lesions. Mouth:  No deformity  or lesions. Neck:  Supple; no masses or thyromegaly. No significant cervical adenopathy. Lungs:  Clear throughout to auscultation.   No wheezes, crackles, or rhonchi. No acute distress. Heart:  Regular rate and rhythm; no murmurs, clicks, rubs,  or gallops. Abdomen: Obese Non-distended, normal bowel sounds.  Soft and nontender without appreciable mass or hepatosplenomegaly.  Pulses:  Normal pulses noted. Extremities:  Without clubbing or edema.  Impression/Plan:  Pleasant 46 year old obese gentleman with intermittent rectal bleeding. I suspect a benign anorectal lesion however, it's been a number of years since he last had his lower GI tract evaluated. In addition, his has refractory reflux symptoms in the setting of excessive daily caffeine use and significant, progressive obesity. Proton pump inhibitor therapy has been inadequate  controlling his reflux symptoms.  Recommendations: Diagnostic EGD and colonoscopy in the near future at the hospital.The risks, benefits, limitations, imponderables and alternatives regarding both EGD and colonoscopy have been reviewed with the patient. Questions have been answered. All parties agreeable.  Weight loss recommended. Patient is to significantly curtail his use of caffeine-containing products. Literature on GERD provided to the patient. Further recommendations to follow endoscopic evaluation.

## 2012-09-08 NOTE — Op Note (Signed)
[facility] [address] [place], [address]   ENDOSCOPY PROCEDURE REPORT  PATIENT: Stephen Alexander, Stephen Alexander  MR#: [id number] BIRTHDATE: July 13, 1966 , 45  yrs. old GENDER: Male ENDOSCOPIST: R. Roetta Sessions, MD FACP [facility] R.  Roetta Sessions, MD FACP [facility] REFERRED BY:  [name], M.D. PROCEDURE DATE:  09/08/2012 PROCEDURE:    diagnostic EGD  INDICATIONS:    GERD refractory to Zegerid  INFORMED CONSENT:   The risks, benefits, limitations, alternatives and imponderables have been discussed.  The potential for biopsy, esophogeal dilation, etc. have also been reviewed.  Questions have been answered.  All parties agreeable.  Please see the history and physical in the medical record for more information.  MEDICATIONS:   Versed 6 mg IV and Demerol 150 mg IV in divided doses. Cetacaine spray.  DESCRIPTION OF PROCEDURE:   The EG-[id number] ([id number])  endoscope was introduced through the mouth and advanced to the second portion of the duodenum without difficulty or limitations.  The mucosal surfaces were surveyed very carefully during advancement of the scope and upon withdrawal.  Retroflexion view of the proximal stomach and esophagogastric junction was performed.      FINDINGS: distal esophageal erosions within 5 mm of the GE junction. No Barrett's esophagus. Stomach empty. Small hiatal hernia. Gastric mucosa, otherwise, appeared normal. Pylorus patent. Normal first and second portion of the duodenum.  THERAPEUTIC / DIAGNOSTIC MANEUVERS PERFORMED:  none   COMPLICATIONS:  None  IMPRESSION:   Mild inflammatory changes of the distal esophagus consistent with erosive reflux esophagitis. Small hiatal hernia.  RECOMMENDATION:Stop Zegerid; begin Dexilant 60 mg orally daily. Lose weight. See EGD report.    _______________________________ R. Roetta Sessions, MD FACP [facility] eSigned:  R. Roetta Sessions, MD FACP [facility] 09/08/2012 2:09 PM     CC:  PATIENT NAME:  Stephen Alexander, Stephen Alexander MR#: [id number]

## 2012-09-08 NOTE — Op Note (Signed)
[facility] [address] [place], [address]   COLONOSCOPY PROCEDURE REPORT  PATIENT: Stephen, Alexander  MR#:         [id number] BIRTHDATE: 02/24/1966 , 45  yrs. old GENDER: Male ENDOSCOPIST: R.  Roetta Sessions, MD FACP [facility] REFERRED BY:  [name], M.D. PROCEDURE DATE:  09/08/2012 PROCEDURE:     diagnostic colonoscopy  INDICATIONS: painless hematochezia  INFORMED CONSENT:  The risks, benefits, alternatives and imponderables including but not limited to bleeding, perforation as well as the possibility of a missed lesion have been reviewed.  The potential for biopsy, lesion removal, etc. have also been discussed.  Questions have been answered.  All parties agreeable. Please see the history and physical in the medical record for more information.  MEDICATIONS: Versed 10 mg IV and Demerol 225 mg IV in divided doses.  DESCRIPTION OF PROCEDURE:  After a digital rectal exam was performed, the EC-[id number] ([id number])  colonoscope was advanced from the anus through the rectum and colon to the area of the cecum, ileocecal valve and appendiceal orifice.  The cecum was deeply intubated.  These structures were well-seen and photographed for the record.  From the level of the cecum and ileocecal valve, the scope was slowly and cautiously withdrawn.  The mucosal surfaces were carefully surveyed utilizing scope tip deflection to facilitate fold flattening as needed.  The scope was pulled down into the rectum where a thorough examination including retroflexion was performed.    FINDINGS:  adequate preparation.Friable anal canal. Normal rectum. Somewhat elongated and redundant, but otherwise normal appearing colon.  THERAPEUTIC / DIAGNOSTIC MANEUVERS PERFORMED:  none  COMPLICATIONS: none  CECAL WITHDRAWAL TIME:  8 minutes  IMPRESSION:  Friable anal canal-likely source of hematochezia otherwise normal rectum and colon  RECOMMENDATIONS: See EGD report. Daily fiber.  Course of Anusol suppositories   _______________________________ eSigned:  R. Roetta Sessions, MD FACP [facility] 09/08/2012 2:27 PM   CC:    PATIENT NAME:  Stephen, Alexander MR#: [id number]

## 2012-09-11 ENCOUNTER — Telehealth: Payer: Self-pay | Admitting: *Deleted

## 2012-09-11 NOTE — Telephone Encounter (Signed)
Mr Breighner called. He was given a prescription for Dexilant and his co pay is too much for him. He would like to see if there is a cheaper medication for him. Please call him back.

## 2012-09-11 NOTE — Telephone Encounter (Signed)
Pt has never taken Nexium or Protonix. Can he try on one these to see if it would be cheaper. Please advise

## 2012-09-11 NOTE — Telephone Encounter (Signed)
Ok I will give him that.   Thanks

## 2012-09-11 NOTE — Telephone Encounter (Signed)
I put some sample up front for him.

## 2012-09-11 NOTE — Telephone Encounter (Signed)
I'd like him to try Dexilant for 3 weeks. Can we give him samples?

## 2012-10-24 ENCOUNTER — Ambulatory Visit (INDEPENDENT_AMBULATORY_CARE_PROVIDER_SITE_OTHER): Payer: 59 | Admitting: Internal Medicine

## 2012-10-24 ENCOUNTER — Encounter: Payer: Self-pay | Admitting: Internal Medicine

## 2012-10-24 VITALS — BP 105/64 | HR 76 | Temp 98.5°F | Ht 70.0 in | Wt 290.4 lb

## 2012-10-24 DIAGNOSIS — K219 Gastro-esophageal reflux disease without esophagitis: Secondary | ICD-10-CM

## 2012-10-24 NOTE — Progress Notes (Signed)
Primary Care Physician:  Colette Ribas, MD Primary Gastroenterologist:  Dr. [name]  Pre-Procedure History & Physical: HPI:  Stephen Alexander is a 46 y.o. male here for followup recent the colonoscopy and EGD. This erosive reflux esophagitis. No Barrett's esophagus. Friable anal canal but otherwise the rectum and colon appeared normal. Rectal bleeding has resolved Anusol-HC. Metamucil. Reflux symptoms now well controlled on Dexilant  - but the patient states too expensive.  Past Medical History  Diagnosis Date  . Back pain     Chronic discogenic   . Hypertension   . Atypical chest pain     history  . Hiatal hernia 2013    small  . GERD (gastroesophageal reflux disease)     Past Surgical History  Procedure Date  . Colonoscopy   11/03/2005    Dr. Elmer Ramp rectum, colon, terminal ileum.  . Colonoscopy 09/08/12    Dr. [name]- friable anal canal o/w normal rectum and colon  . Esophagogastroduodenoscopy 09/08/12    Dr. [name]- mild inflammatory changes of the distal esophagus consistent with erosive reflux esophagitits. small hiatal hernia    Prior to Admission medications   Medication Sig Start Date End Date Taking? Authorizing Provider  ALPRAZolam Prudy Feeler) 0.5 MG tablet Take 0.5 mg by mouth at bedtime as needed.   Yes Historical Provider, MD  aspirin 81 MG tablet Take 81 mg by mouth daily.   Yes Historical Provider, MD  Chromium 400 MCG TABS Take 400 mg by mouth daily.   Yes Historical Provider, MD  Cinnamon 500 MG TABS Take 500 mg by mouth daily.   Yes Historical Provider, MD  Coenzyme Q10 (CO Q-10) 300 MG CAPS Take 300 mg by mouth daily.   Yes Historical Provider, MD  cyclobenzaprine (FLEXERIL) 10 MG tablet Take 10 mg by mouth 3 (three) times daily as needed.   Yes Historical Provider, MD  ergocalciferol (VITAMIN D2) 50000 UNITS capsule Take 50,000 Units by mouth once a week.   Yes Historical Provider, MD  HYDROcodone-acetaminophen (LORCET) 10-650 MG per tablet Take 1 tablet by  mouth every 6 (six) hours as needed.   Yes Historical Provider, MD  ibuprofen (ADVIL,MOTRIN) 200 MG tablet Take 200 mg by mouth every 6 (six) hours as needed.   Yes Historical Provider, MD  losartan (COZAAR) 50 MG tablet Take 50 mg by mouth daily.   Yes Historical Provider, MD  nebivolol (BYSTOLIC) 5 MG tablet Take 5 mg by mouth daily.   Yes Historical Provider, MD  OMEGA-3 KRILL OIL 300 MG CAPS Take by mouth daily.   Yes Historical Provider, MD  peg 3350 powder (MOVIPREP) 100 G SOLR Take 1 kit (100 g total) by mouth as directed. 09/05/12  Yes [name], MD  Red Yeast Rice 600 MG TABS Take 600 mg by mouth 2 (two) times daily.   Yes Historical Provider, MD  UNABLE TO FIND Cinsulin 2 daily   Yes Historical Provider, MD    Allergies as of 10/24/2012  . (No Known Allergies)    No family history on file.  History   Social History  . Marital Status: Single    Spouse Name: N/A    Number of Children: N/A  . Years of Education: N/A   Occupational History  . Not on file.   Social History Main Topics  . Smoking status: Never Smoker   . Smokeless tobacco: Not on file  . Alcohol Use: No  . Drug Use: No  . Sexually Active: Not on file  Other Topics Concern  . Not on file   Social History Narrative  . No narrative on file    Review of Systems: See HPI, otherwise negative ROS  Physical Exam: BP 105/64  Pulse 76  Temp 98.5 F (36.9 C) (Temporal)  Ht 5\' 10"  (1.778 m)  Wt 290 lb 6.4 oz (131.725 kg)  BMI 41.67 kg/m2 General:   Alert,  Well-developed, well-nourished, pleasant and cooperative in NAD Detailed exam deferred  Impression/Plan:  46 year old obese gentleman with erosive reflux esophagitis. Dexilant worked nicely in controlling his reflux symptoms. He really failed Zegerid. Rectal bleeding has resolved. He will be due for routine average risk screening colonoscopy in 10 years.  Recommendations: Discussed the multipronged approach regarding management of GERD with the  patient. He really needs to lose weight. I recommend he lose 15-20 pounds over the next 12 months. We'll write him a prescription for generic Protonix to see if this works better as far as cost and efficacy is concerned.  He is to let us know  In 2 weeks one way or the other how Protonix is working for him . Office visit with [place] in 6 months.

## 2012-10-24 NOTE — Patient Instructions (Signed)
Loose weight  Stop Dexilant; Try Protonix 40 mg daily  GERD information provided  Office visit in 6 months  Call in 2 weeks and let me know how the medication is working

## 2013-01-08 ENCOUNTER — Other Ambulatory Visit: Payer: Self-pay | Admitting: *Deleted

## 2013-01-08 MED ORDER — DEXLANSOPRAZOLE 60 MG PO CPDR: 60.0000 mg | DELAYED_RELEASE_CAPSULE | Freq: Every day | ORAL | Status: DC

## 2013-01-08 NOTE — Telephone Encounter (Signed)
Stephen Alexander called today. He was given some free samples of protonix to try at his last appt. He would like a 90 day supply called into his pharmacy, [facility]. Thanks.

## 2013-01-08 NOTE — Telephone Encounter (Signed)
90 day supply of Dexilant sent to pharmacy.

## 2013-01-08 NOTE — Telephone Encounter (Signed)
We dont get protonix samples. Pt was given dexilant samples.

## 2013-04-24 ENCOUNTER — Encounter: Payer: Self-pay | Admitting: Internal Medicine

## 2013-04-24 ENCOUNTER — Ambulatory Visit (INDEPENDENT_AMBULATORY_CARE_PROVIDER_SITE_OTHER): Payer: 59 | Admitting: Internal Medicine

## 2013-04-24 VITALS — BP 134/80 | HR 89 | Temp 98.3°F | Ht 70.0 in | Wt 303.0 lb

## 2013-04-24 DIAGNOSIS — K219 Gastro-esophageal reflux disease without esophagitis: Secondary | ICD-10-CM

## 2013-04-24 NOTE — Progress Notes (Signed)
p 

## 2013-04-24 NOTE — Patient Instructions (Addendum)
Continue Protonix 40 mg daily  Weight loss very important   See Dr. Phillips Odor regarding management of sleep apnea  GERD information  Office visit in 6 months

## 2013-04-24 NOTE — Progress Notes (Signed)
Primary Care Physician:  Colette Ribas, MD Primary Gastroenterologist:  Dr. [name]  Pre-Procedure History & Physical: HPI:  Stephen Alexander is a 47 y.o. male here for followup of GERD. Takes proton X. in lieu of Dexilant because of insurance company policy. Reflux symptoms fairly well controlled. Occasionally has to supplement with OTC antacids.Marland Kitchen Dysphagia. Feels fatigued all time. Prior diagnosis of sleep apnea. Does not use CPAP. He continues to gain weight.  Past Medical History  Diagnosis Date  . Back pain     Chronic discogenic   . Hypertension   . Atypical chest pain     history  . Hiatal hernia 2013    small  . GERD (gastroesophageal reflux disease)     Past Surgical History  Procedure Laterality Date  . Colonoscopy    11/03/2005    Dr. Elmer Ramp rectum, colon, terminal ileum.  . Colonoscopy  09/08/12    Dr. [name]- friable anal canal o/w normal rectum and colon  . Esophagogastroduodenoscopy  09/08/12    Dr. [name]- mild inflammatory changes of the distal esophagus consistent with erosive reflux esophagitits. small hiatal hernia    Prior to Admission medications   Medication Sig Start Date End Date Taking? Authorizing Provider  ALPRAZolam Prudy Feeler) 0.5 MG tablet Take 0.5 mg by mouth at bedtime as needed.   Yes Historical Provider, MD  aspirin 81 MG tablet Take 81 mg by mouth daily.   Yes Historical Provider, MD  Chromium 400 MCG TABS Take 400 mg by mouth daily.   Yes Historical Provider, MD  Cinnamon 500 MG TABS Take 500 mg by mouth daily.   Yes Historical Provider, MD  Coenzyme Q10 (CO Q-10) 300 MG CAPS Take 300 mg by mouth daily.   Yes Historical Provider, MD  cyclobenzaprine (FLEXERIL) 10 MG tablet Take 10 mg by mouth 3 (three) times daily as needed.   Yes Historical Provider, MD  ergocalciferol (VITAMIN D2) 50000 UNITS capsule Take 50,000 Units by mouth once a week.   Yes Historical Provider, MD  HYDROcodone-acetaminophen (LORCET) 10-650 MG per tablet Take 1 tablet  by mouth every 6 (six) hours as needed.   Yes Historical Provider, MD  ibuprofen (ADVIL,MOTRIN) 200 MG tablet Take 200 mg by mouth every 6 (six) hours as needed.   Yes Historical Provider, MD  losartan (COZAAR) 50 MG tablet Take 50 mg by mouth daily.   Yes Historical Provider, MD  nebivolol (BYSTOLIC) 5 MG tablet Take 5 mg by mouth daily.   Yes Historical Provider, MD  OMEGA-3 KRILL OIL 300 MG CAPS Take by mouth daily.   Yes Historical Provider, MD  pantoprazole (PROTONIX) 40 MG tablet Take 40 mg by mouth daily.   Yes Historical Provider, MD  Red Yeast Rice 600 MG TABS Take 600 mg by mouth 2 (two) times daily.   Yes Historical Provider, MD  UNABLE TO FIND Cinsulin 2 daily   Yes Historical Provider, MD  peg 3350 powder (MOVIPREP) 100 G SOLR Take 1 kit (100 g total) by mouth as directed. 09/05/12   [name], MD    Allergies as of 04/24/2013  . (No Known Allergies)    No family history on file.  History   Social History  . Marital Status: Single    Spouse Name: N/A    Number of Children: N/A  . Years of Education: N/A   Occupational History  . Not on file.   Social History Main Topics  . Smoking status: Never Smoker   .  Smokeless tobacco: Not on file  . Alcohol Use: No  . Drug Use: No  . Sexually Active: Not on file   Other Topics Concern  . Not on file   Social History Narrative  . No narrative on file    Review of Systems: See HPI, otherwise negative ROS  Physical Exam: BP 134/80  Pulse 89  Temp(Src) 98.3 F (36.8 C) (Oral)  Ht 5\' 10"  (1.778 m)  Wt 303 lb (137.44 kg)  BMI 43.48 kg/m2 General:   Alert,  Well-developed, obese, well-nourished, pleasant and cooperative in NAD Skin:  Intact without significant lesions or rashes. Eyes:  Sclera clear, no icterus.   Conjunctiva pink. Ears:  Normal auditory acuity. Nose:  No deformity, discharge,  or lesions. Mouth:  No deformity or lesions. Neck:  Supple; no masses or thyromegaly. No significant cervical  adenopathy. Lungs:  Clear throughout to auscultation.   No wheezes, crackles, or rhonchi. No acute distress. Heart:  Regular rate and rhythm; no murmurs, clicks, rubs,  or gallops. Abdomen: Obese, normal bowel sounds.  Soft and nontender without appreciable mass or hepatosplenomegaly.  Pulses:  Normal pulses noted. Extremities:  Without clubbing or edema.  Impression/Plan:  Morbidly obese 47 year old gentleman with erosive reflux esophagitis on Protonix 40 mg orally daily. Breakthrough symptoms 2-3 times a month for which he takes antacids. He has profound fatigue these days. Feels like he does not get any sleep. He states he did have a sleep study -  was offered CPAP, I suspect with progressive weight gain he does have significant underlying sleep apnea.  Continue Protonix 40 mg daily. Strive to lose 15-20 pounds between now and the end of the calendar year. I strongly recommend the patient lose weight and follow up with Dr. Phillips Odor regarding management of sleep apnea.  Office visit here in 6 months.

## 2013-04-25 ENCOUNTER — Encounter: Payer: Self-pay | Admitting: Internal Medicine

## 2013-08-04 ENCOUNTER — Encounter: Payer: Self-pay | Admitting: *Deleted

## 2013-08-07 ENCOUNTER — Encounter: Payer: Self-pay | Admitting: Cardiovascular Disease

## 2013-08-08 ENCOUNTER — Ambulatory Visit (INDEPENDENT_AMBULATORY_CARE_PROVIDER_SITE_OTHER): Payer: 59 | Admitting: Cardiovascular Disease

## 2013-08-08 ENCOUNTER — Encounter: Payer: Self-pay | Admitting: Cardiovascular Disease

## 2013-08-08 VITALS — BP 114/68 | HR 83 | Resp 16 | Ht 70.0 in | Wt 296.4 lb

## 2013-08-08 DIAGNOSIS — E785 Hyperlipidemia, unspecified: Secondary | ICD-10-CM

## 2013-08-08 DIAGNOSIS — G4733 Obstructive sleep apnea (adult) (pediatric): Secondary | ICD-10-CM

## 2013-08-08 DIAGNOSIS — R7309 Other abnormal glucose: Secondary | ICD-10-CM

## 2013-08-08 DIAGNOSIS — I10 Essential (primary) hypertension: Secondary | ICD-10-CM

## 2013-08-08 DIAGNOSIS — R739 Hyperglycemia, unspecified: Secondary | ICD-10-CM

## 2013-08-08 NOTE — Patient Instructions (Addendum)
Your physician recommends that you schedule a follow-up appointment in: 1 year.   Your physician recommends that you have a sleep study. Please call when you are ready to schedule.  Your physician encouraged you to lose weight for better health.

## 2013-08-11 ENCOUNTER — Encounter: Payer: Self-pay | Admitting: Cardiovascular Disease

## 2013-08-11 DIAGNOSIS — I10 Essential (primary) hypertension: Secondary | ICD-10-CM | POA: Insufficient documentation

## 2013-08-11 DIAGNOSIS — E785 Hyperlipidemia, unspecified: Secondary | ICD-10-CM | POA: Insufficient documentation

## 2013-08-11 DIAGNOSIS — G4733 Obstructive sleep apnea (adult) (pediatric): Secondary | ICD-10-CM | POA: Insufficient documentation

## 2013-08-11 DIAGNOSIS — R739 Hyperglycemia, unspecified: Secondary | ICD-10-CM | POA: Insufficient documentation

## 2013-08-11 NOTE — Assessment & Plan Note (Signed)
This is definitely the root of many of Stephen Alexander's health problems including diabetes, hypertension, low back problems and possible obstructive sleep apnea. Even be contributing to his acid reflux and is definitely contributing to his knee complaints. Losing a lot of weight to something that we have discussed on numerous occasions. Every time Stephen Alexander expresses a desire to lose weight but seems to be convinced that he has no chances of success because of his inability to resist food temptations. He now seems to be using his back and leg problems as excuses to avoid exercising as well. I tried to instill him a little more enthusiasm and what needs to be a lifelong project of reaching and maintaining a healthy weight.

## 2013-08-11 NOTE — Assessment & Plan Note (Signed)
He has several features that suggest this diagnosis although it is not by any means categorically present. I strongly recommended that he have a sleep study. He is very reluctant since he does not think he will comply with CPAP therapy anyway. We discussed the potential adverse consequences of untreated sleep apnea at length. It also reviewed some potential alternative treatments such as jaw advancement devices or surgical ENT procedures. The first step would be to establish whether or not he has this disorder. He is still undecided.

## 2013-08-11 NOTE — Assessment & Plan Note (Signed)
Controlled, normal kidney function.

## 2013-08-11 NOTE — Progress Notes (Signed)
Patient ID: Stephen Alexander, male   DOB: 11-15-1966, 47 y.o.   MRN: [id number]  .    Reason for office visit Followup hypertension, dyslipidemia, morbid obesity  Stephen Alexander has not managed to lose any weight since his last appointment. In fact he has gained a few pounds. As always he ignored his that he makes poor using choices but seems to think that he cannot change his behavior. He has developed a lot of problems of low back pain and has actually been prescribed a short course of prednisone. For while he could only walk with a shuffling gait. This is improved. 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 Has had some shooting pain consistent with neuropathic symptoms as well. More worrisome the fact that for a while he had lost sensation in his anterior thigh. Separately he has been diagnosed with esophageal erosions by endoscopy and has begun treatment for acid reflux. He had hematochezia apparently related to proctitis. He does not have any cardiovascular complaints. He is a loud snorer. He wakes up feeling tired in the morning. He takes daytime naps. He has never fallen asleep on the job or while driving the car.   No Known Allergies  Current Outpatient Prescriptions  Medication Sig Dispense Refill  . ALPRAZolam (XANAX) 0.5 MG tablet Take 0.5 mg by mouth at bedtime as needed.      Marland Kitchen aspirin 81 MG tablet Take 81 mg by mouth daily.      . Cinnamon 500 MG TABS Take 500 mg by mouth daily.      . Coenzyme Q10 (CO Q-10) 300 MG CAPS Take 300 mg by mouth daily.      . cyclobenzaprine (FLEXERIL) 10 MG tablet Take 10 mg by mouth 3 (three) times daily as needed.      . ergocalciferol (VITAMIN D2) 50000 UNITS capsule Take 50,000 Units by mouth once a week.      Marland Kitchen HYDROcodone-acetaminophen (NORCO) 10-325 MG per tablet Take 1 tablet by mouth every 6 (six) hours as needed for pain.      Marland Kitchen losartan (COZAAR) 50 MG tablet Take 50 mg by mouth daily.      . nebivolol (BYSTOLIC) 5 MG  tablet Take 5 mg by mouth daily.      . OMEGA-3 KRILL OIL 300 MG CAPS Take by mouth daily.      . pantoprazole (PROTONIX) 40 MG tablet Take 40 mg by mouth daily.      . predniSONE (DELTASONE) 10 MG tablet Take 5 tablets x3 days, then take 4 tablets x3 days, then 3 tablets x3 days, then 2 tablets x3 days, then 1 tablet x3 days, then stop.      . Red Yeast Rice 600 MG TABS Take 600 mg by mouth 2 (two) times daily.      Marland Kitchen UNABLE TO FIND Cinsulin 2 daily       No current facility-administered medications for this visit.    Past Medical History  Diagnosis Date  . Back pain     Chronic discogenic   . Hypertension   . Atypical chest pain     history  . Hiatal hernia 2013    small  . GERD (gastroesophageal reflux disease)   . Hyperlipidemia   . DM (diabetes mellitus)     Past Surgical History  Procedure Laterality Date  . Colonoscopy    11/03/2005    Dr. Elmer Ramp rectum, colon, terminal ileum.  . Colonoscopy  09/08/12  Dr. [name]- friable anal canal o/w normal rectum and colon  . Esophagogastroduodenoscopy  09/08/12    Dr. [name]- mild inflammatory changes of the distal esophagus consistent with erosive reflux esophagitits. small hiatal hernia  . US echocardiography  01/30/2010    normal  . Nm myocar perf wall motion  01/30/2010    normal, EF  51%    Family History  Problem Relation Age of Onset  . Heart failure Father   . Hypertension Father     History   Social History  . Marital Status: Single    Spouse Name: N/A    Number of Children: N/A  . Years of Education: N/A   Occupational History  . Not on file.   Social History Main Topics  . Smoking status: Former Games developer  . Smokeless tobacco: Never Used     Comment: Socially  . Alcohol Use: No  . Drug Use: No  . Sexual Activity: Not on file   Other Topics Concern  . Not on file   Social History Narrative  . No narrative on file    Review of systems: Significant for low back pain weak legs and bilateral knee  pain, acid reflux, hematochezia recently. The patient specifically denies any chest pain at rest or with exertion, dyspnea at rest or with exertion, orthopnea, paroxysmal nocturnal dyspnea, syncope, palpitations, focal neurological deficits, intermittent claudication, lower extremity edema, unexplained weight gain, cough, hemoptysis or wheezing.  The patient also denies abdominal pain, nausea, vomiting, dysphagia, diarrhea, constipation, polyuria, polydipsia, dysuria, hematuria, frequency, urgency, abnormal bleeding or bruising, fever, chills, unexpected weight changes, mood swings, change in skin or hair texture, change in voice quality, auditory or visual problems, allergic reactions or rashes   PHYSICAL EXAM BP 114/68  Pulse 83  Resp 16  Ht 5\' 10"  (1.778 m)  Wt 296 lb 6.4 oz (134.446 kg)  BMI 42.53 kg/m2  General: Alert, oriented x3, no distress, morbidly obese Head: no evidence of trauma, PERRL, EOMI, no exophtalmos or lid lag, no myxedema, no xanthelasma; normal ears, nose and oropharynx Neck: normal jugular venous pulsations and no hepatojugular reflux; brisk carotid pulses without delay and no carotid bruits Chest: clear to auscultation, no signs of consolidation by percussion or palpation, normal fremitus, symmetrical and full respiratory excursions Cardiovascular: normal position and quality of the apical impulse, regular rhythm, normal first and second heart sounds, no murmurs, rubs or gallops Abdomen: no tenderness or distention, no masses by palpation, no abnormal pulsatility or arterial bruits, normal bowel sounds, no hepatosplenomegaly Extremities: no clubbing, cyanosis or edema; 2+ radial, ulnar and brachial pulses bilaterally; 2+ right femoral, posterior tibial and dorsalis pedis pulses; 2+ left femoral, posterior tibial and dorsalis pedis pulses; no subclavian or femoral bruits Neurological: grossly nonfocal   EKG: Normal sinus rhythm, normal tracing  Lipid Panel  2013  total cholesterol 156, triglycerides 139, HDL 32, LDL 96  BMET Sodium 139, potassium 4.2, BUN 0.85, creatinine 0.6, normal liver function tests Hemoglobin 14.9 Hemoglobin A1c 6.1%   ASSESSMENT AND PLAN OSA (obstructive sleep apnea) - presumptive diagnosis He has several features that suggest this diagnosis although it is not by any means categorically present. I strongly recommended that he have a sleep study. He is very reluctant since he does not think he will comply with CPAP therapy anyway. We discussed the potential adverse consequences of untreated sleep apnea at length. It also reviewed some potential alternative treatments such as jaw advancement devices or surgical ENT procedures. The first step would  be to establish whether or not he has this disorder. He is still undecided.  HTN (hypertension) Controlled, normal kidney function.  Hyperglycemia He meets criteria for diabetes mellitus but has a near-normal hemoglobin A1c of 6.1% without antidiabetic medications  Morbid obesity This is definitely the root of many of Stephen Alexander's health problems including diabetes, hypertension, low back problems and possible obstructive sleep apnea. Even be contributing to his acid reflux and is definitely contributing to his knee complaints. Losing a lot of weight to something that we have discussed on numerous occasions. Every time Mr. Saraceni expresses a desire to lose weight but seems to be convinced that he has no chances of success because of his inability to resist food temptations. He now seems to be using his back and leg problems as excuses to avoid exercising as well. I tried to instill him a little more enthusiasm and what needs to be a lifelong project of reaching and maintaining a healthy weight.  Dyslipidemia - low HDL-C    Orders Placed This Encounter  Procedures  . EKG 12-Lead   Meds ordered this encounter  Medications  . HYDROcodone-acetaminophen (NORCO) 10-325 MG per tablet     Sig: Take 1 tablet by mouth every 6 (six) hours as needed for pain.  . predniSONE (DELTASONE) 10 MG tablet    Sig: Take 5 tablets x3 days, then take 4 tablets x3 days, then 3 tablets x3 days, then 2 tablets x3 days, then 1 tablet x3 days, then stop.    Junious Silk, MD, [facility] [phone] office [phone] pager

## 2013-08-11 NOTE — Assessment & Plan Note (Signed)
He meets criteria for diabetes mellitus but has a near-normal hemoglobin A1c of 6.1% without antidiabetic medications

## 2013-11-13 IMAGING — US US ABDOMEN COMPLETE
1 series · 14 of 25 positions shown · non-contrast
Comparison: None.

CLINICAL DATA: Abdominal pain.  Hypertension.  Diabetes.

ABDOMINAL ULTRASOUND COMPLETE

[Series 1: us abdomen complete · 0.29mm/px · 14 of 163 slices shown]
[im 1/163]
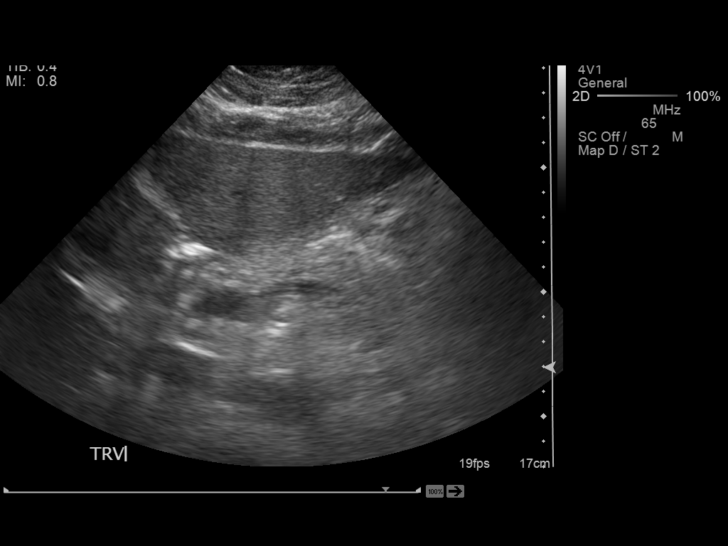
[im 14/163]
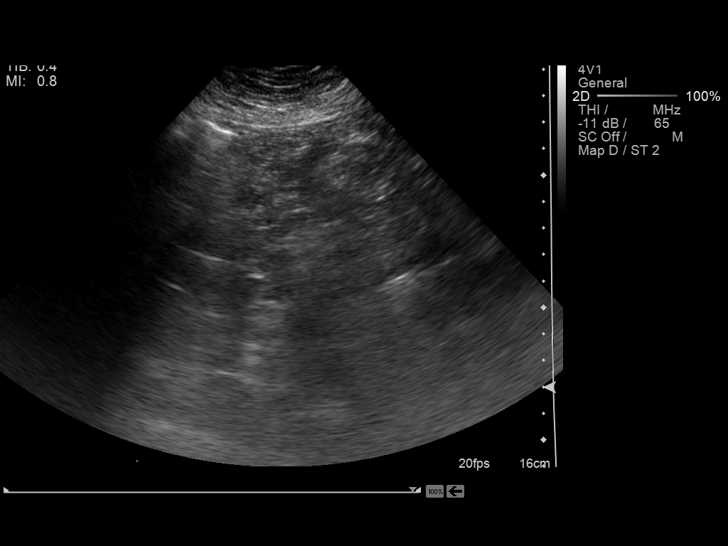
[im 28/163]
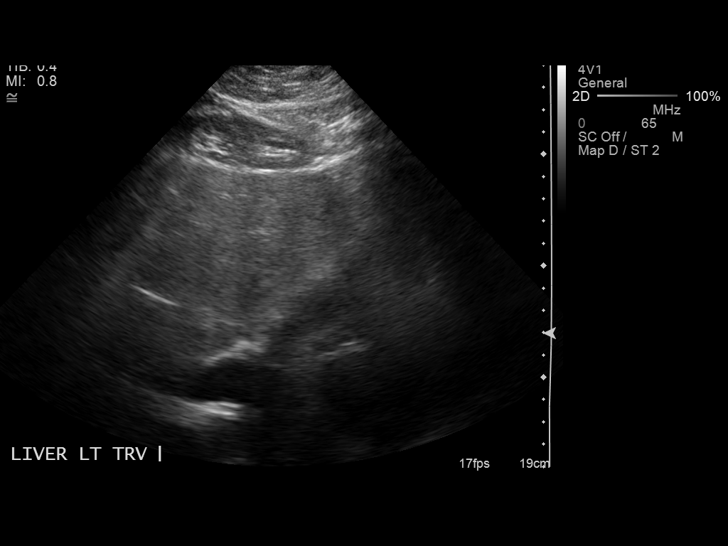
[im 41/163]
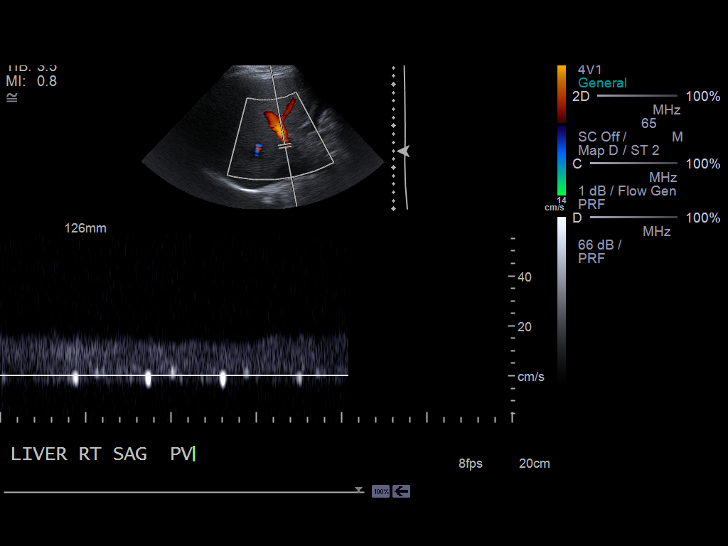
[im 55/163]
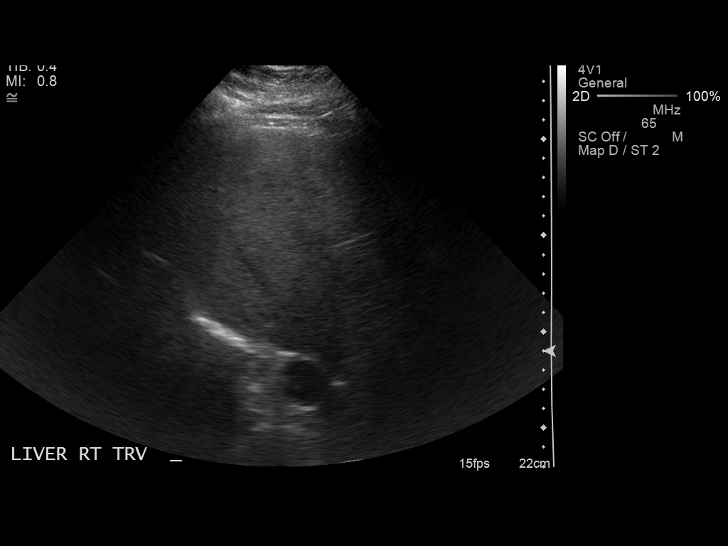
[im 61/163]
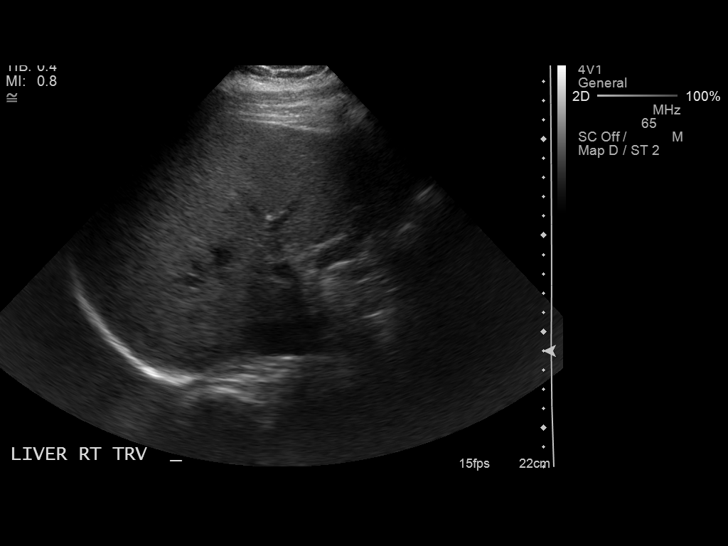
[im 75/163]
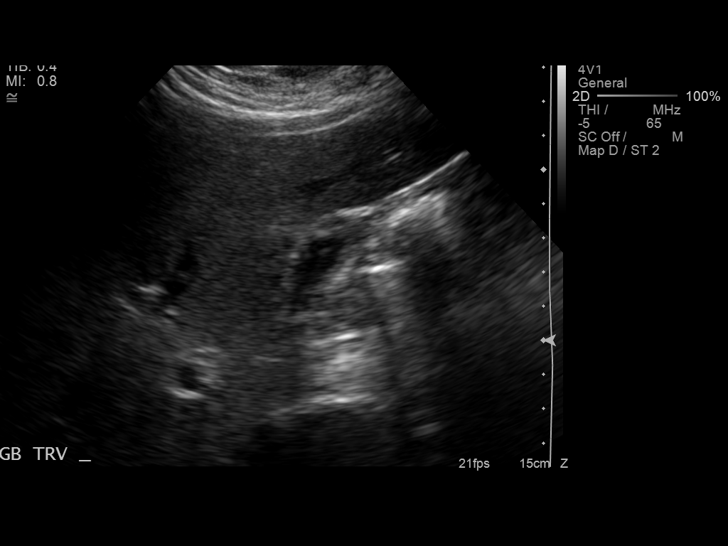
[im 88/163]
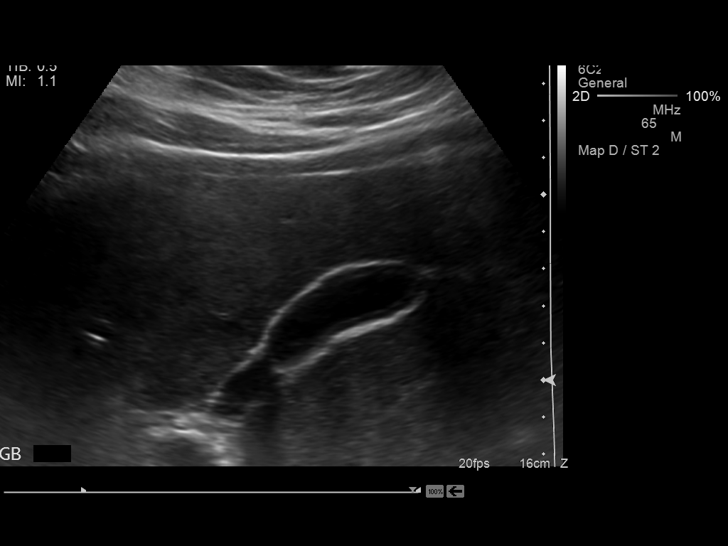
[im 102/163]
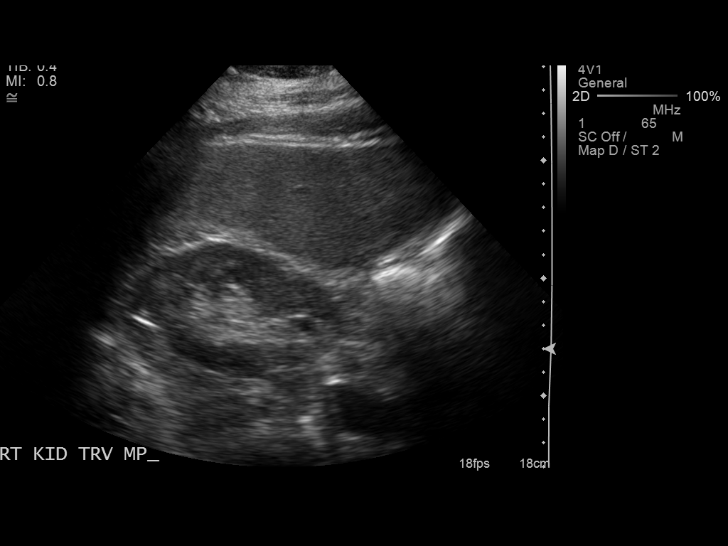
[im 109/163]
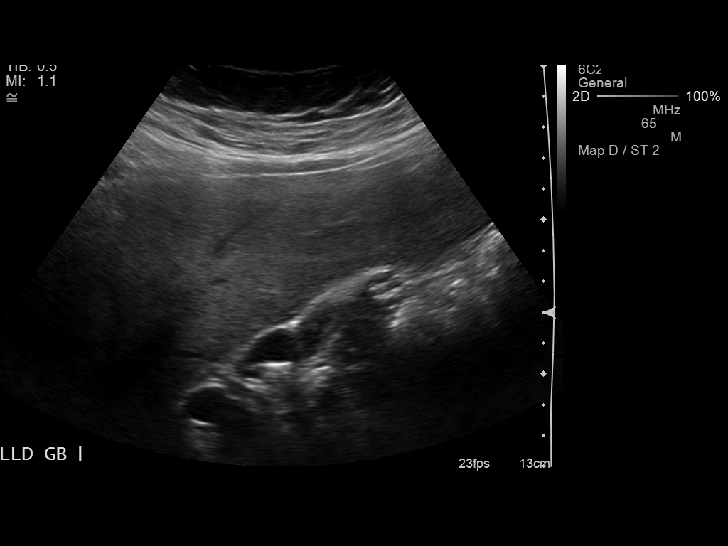
[im 122/163]
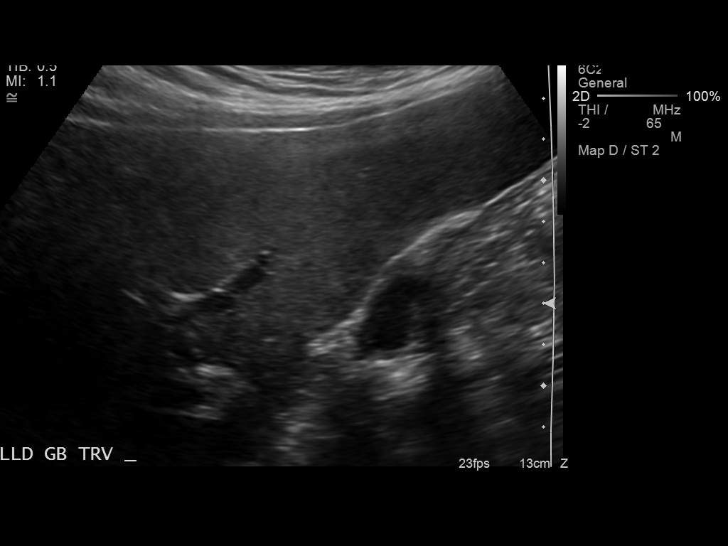
[im 136/163]
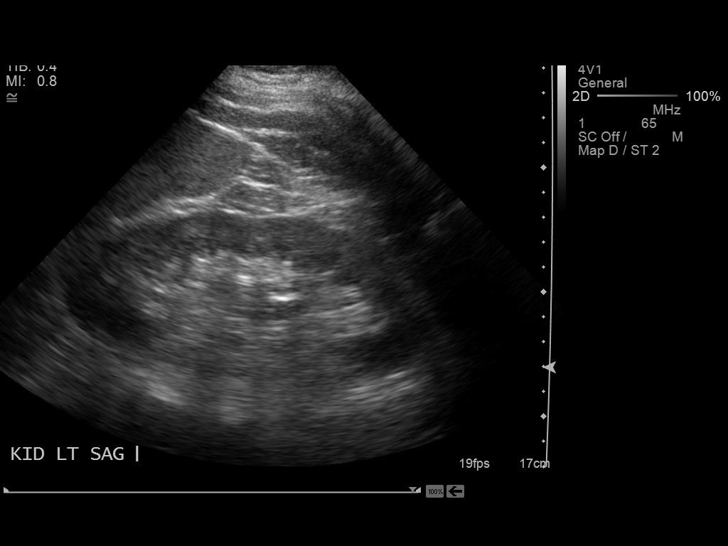
[im 149/163]
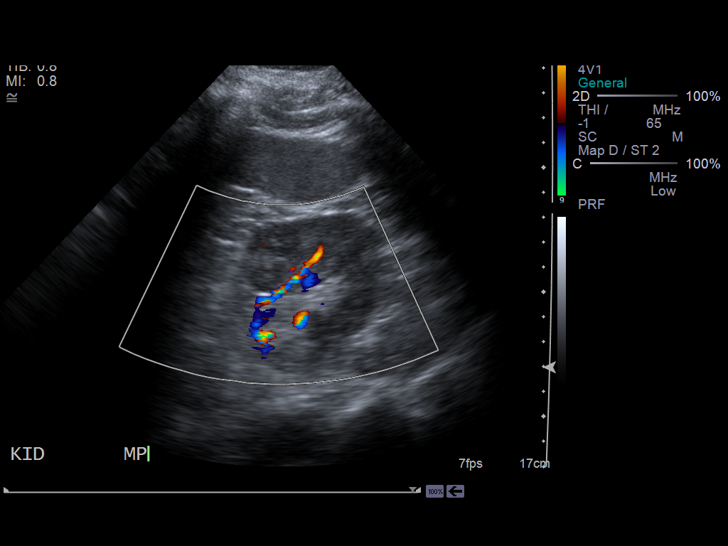
[im 163/163]
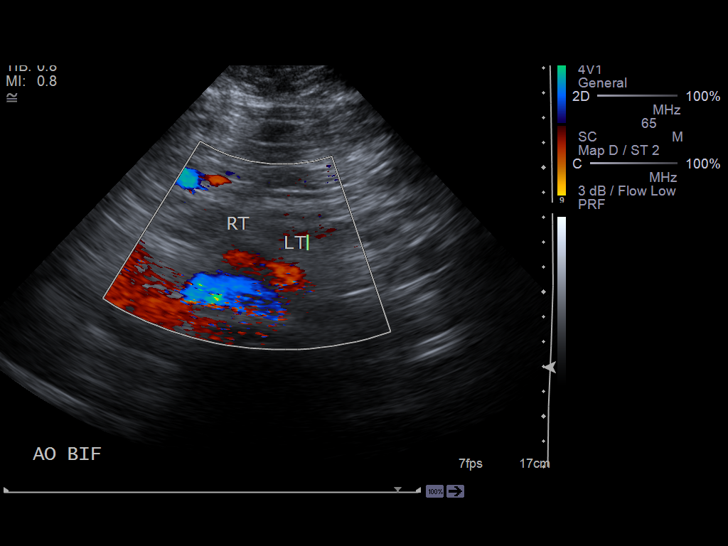

[14 of 25 positions shown; findings below may reference images not displayed]

FINDINGS: Gallbladder:  Incompletely distended.  No gallstones, gallbladder
wall thickening, or pericholecystic fluid.

Common Bile Duct:  Within normal limits in caliber. Measures 5 mm
in diameter.

Liver: No focal mass lesion identified.  Mildly increased
parenchymal echogenicity, suspicious for mild hepatic steatosis.

IVC:  Appears normal.

Pancreas:  No abnormality identified.

Spleen:  Within normal limits in size and echotexture.

Right kidney:  Normal in size and parenchymal echogenicity.  No
evidence of mass or hydronephrosis.

Left kidney:  Normal in size and parenchymal echogenicity.  No
evidence of mass or hydronephrosis.  Incidentally noted is a
dromedary hump which is a normal anatomic variant.

Abdominal Aorta:  No aneurysm identified.
IMPRESSION: 1.  No evidence of gallstones, biliary dilatation, or other acute
findings.
2.  Probable mild hepatic steatosis.

## 2013-12-18 ENCOUNTER — Encounter: Payer: Self-pay | Admitting: Cardiovascular Disease

## 2013-12-18 ENCOUNTER — Ambulatory Visit (INDEPENDENT_AMBULATORY_CARE_PROVIDER_SITE_OTHER): Payer: 59 | Admitting: Cardiovascular Disease

## 2013-12-18 ENCOUNTER — Encounter (HOSPITAL_COMMUNITY): Payer: Self-pay | Admitting: *Deleted

## 2013-12-18 VITALS — BP 120/74 | HR 79 | Resp 16 | Ht 70.0 in | Wt 289.4 lb

## 2013-12-18 DIAGNOSIS — I739 Peripheral vascular disease, unspecified: Secondary | ICD-10-CM

## 2013-12-18 DIAGNOSIS — I10 Essential (primary) hypertension: Secondary | ICD-10-CM

## 2013-12-18 NOTE — Patient Instructions (Signed)
Your physician recommends that you schedule a follow-up appointment in: 1 year with Dr Royann Shivers  Your physician has requested that you have a lower extremity arterial duplex. This test is an ultrasound of the arteries in the legs . It looks at arterial blood flow in the legs. Allow one hour for Lower and Upper Arterial scans. There are no restrictions or special instructions

## 2013-12-21 ENCOUNTER — Encounter: Payer: Self-pay | Admitting: Cardiovascular Disease

## 2013-12-21 NOTE — Progress Notes (Signed)
Patient ID: Stephen Alexander, male   DOB: [birth year], 47 y.o.   MRN: [id number]      Reason for office visit:  hyperlipidemia, hypertension, OSA  Stephen Alexander seems to be more engaged in his own health than at previous appointments. He remains morbidly obese, but speaks about food calorie content and mentions physical exercise.There is objective evidence of his improved lifestyle. Hgb A1c has decreased from 6.5% to 5.9%.  He complains of numbness and pain in both feet, as well as left calf pain, inconsistently associated with walking.  No Known Allergies  Current Outpatient Prescriptions  Medication Sig Dispense Refill  . ALPRAZolam (XANAX) 0.5 MG tablet Take 0.5 mg by mouth at bedtime as needed.      Marland Kitchen aspirin 81 MG tablet Take 81 mg by mouth daily.      . Cinnamon 500 MG TABS Take 500 mg by mouth daily.      . Coenzyme Q10 (CO Q-10) 300 MG CAPS Take 300 mg by mouth daily.      . cyclobenzaprine (FLEXERIL) 10 MG tablet Take 10 mg by mouth 3 (three) times daily as needed.      . ergocalciferol (VITAMIN D2) 50000 UNITS capsule Take 50,000 Units by mouth once a week.      Marland Kitchen HYDROcodone-acetaminophen (NORCO) 10-325 MG per tablet Take 1 tablet by mouth every 6 (six) hours as needed for pain.      Marland Kitchen losartan (COZAAR) 50 MG tablet Take 50 mg by mouth daily.      . nebivolol (BYSTOLIC) 5 MG tablet Take 5 mg by mouth daily.      . OMEGA-3 KRILL OIL 300 MG CAPS Take by mouth daily.      . pantoprazole (PROTONIX) 40 MG tablet Take 40 mg by mouth daily.      . Red Yeast Rice 600 MG TABS Take 600 mg by mouth 2 (two) times daily.       No current facility-administered medications for this visit.    Past Medical History  Diagnosis Date  . Back pain     Chronic discogenic   . Hypertension   . Atypical chest pain     history  . Hiatal hernia 2013    small  . GERD (gastroesophageal reflux disease)   . Hyperlipidemia   . DM (diabetes mellitus)     Past Surgical History  Procedure Laterality Date    . Colonoscopy    11/03/2005    Dr. Elmer Ramp rectum, colon, terminal ileum.  . Colonoscopy  09/08/12    Dr. [name]- friable anal canal o/w normal rectum and colon  . Esophagogastroduodenoscopy  09/08/12    Dr. [name]- mild inflammatory changes of the distal esophagus consistent with erosive reflux esophagitits. small hiatal hernia  . US echocardiography  01/30/2010    normal  . Nm myocar perf wall motion  01/30/2010    normal, EF  51%    Family History  Problem Relation Age of Onset  . Heart failure Father   . Hypertension Father     History   Social History  . Marital Status: Single    Spouse Name: N/A    Number of Children: N/A  . Years of Education: N/A   Occupational History  . Not on file.   Social History Main Topics  . Smoking status: Former Games developer  . Smokeless tobacco: Never Used     Comment: Socially  . Alcohol Use: No  . Drug Use: No  . Sexual Activity: Not  on file   Other Topics Concern  . Not on file   Social History Narrative  . No narrative on file    Review of systems: Significant for low back pain weak legs and bilateral knee pain, acid reflux, hematochezia recently.  The patient specifically denies any chest pain at rest or with exertion, dyspnea at rest or with exertion, orthopnea, paroxysmal nocturnal dyspnea, syncope, palpitations, focal neurological deficits, intermittent claudication, lower extremity edema, unexplained weight gain, cough, hemoptysis or wheezing.  The patient also denies abdominal pain, nausea, vomiting, dysphagia, diarrhea, constipation, polyuria, polydipsia, dysuria, hematuria, frequency, urgency, abnormal bleeding or bruising, fever, chills, unexpected weight changes, mood swings, change in skin or hair texture, change in voice quality, auditory or visual problems, allergic reactions or rash   PHYSICAL EXAM BP 120/74  Pulse 79  Resp 16  Ht 5\' 10"  (1.778 m)  Wt 289 lb 6.4 oz (131.271 kg)  BMI 41.52 kg/m2 General: Alert,  oriented x3, no distress, morbidly obese  Head: no evidence of trauma, PERRL, EOMI, no exophtalmos or lid lag, no myxedema, no xanthelasma; normal ears, nose and oropharynx  Neck: normal jugular venous pulsations and no hepatojugular reflux; brisk carotid pulses without delay and no carotid bruits  Chest: clear to auscultation, no signs of consolidation by percussion or palpation, normal fremitus, symmetrical and full respiratory excursions  Cardiovascular: normal position and quality of the apical impulse, regular rhythm, normal first and second heart sounds, no murmurs, rubs or gallops  Abdomen: no tenderness or distention, no masses by palpation, no abnormal pulsatility or arterial bruits, normal bowel sounds, no hepatosplenomegaly  Extremities: no clubbing, cyanosis or edema; 2+ radial, ulnar and brachial pulses bilaterally; 2+ right femoral, posterior tibial and dorsalis pedis pulses; 2+ left femoral, posterior tibial and dorsalis pedis pulses; no subclavian or femoral bruits  Neuro: nonfocal  Lipid Panel  Total cholesterol 146, triglycerides 156, HDL 36, LDL 79, glucose 123, creatinine 0.7  ASSESSMENT AND PLAN I congratulated Stephen Alexander on his improved diet, weight loss and improved glycemic control. Lipd profile and BP are good. Recommended he continue this improved pattern of eating and start including regular physical exercise. Will check for PAD although his symptoms may be neuropathic rather than ischemic.  Patient Instructions  Your physician recommends that you schedule a follow-up appointment in: 1 year with Dr Royann Shivers  Your physician has requested that you have a lower extremity arterial duplex. This test is an ultrasound of the arteries in the legs . It looks at arterial blood flow in the legs. Allow one hour for Lower and Upper Arterial scans. There are no restrictions or special instructions      Orders Placed This Encounter  Procedures  . EKG 12-Lead  . Lower Extremity  Arterial Duplex   No orders of the defined types were placed in this encounter.    Junious Silk, MD, [facility] [phone] office [phone] pager

## 2013-12-24 ENCOUNTER — Encounter: Payer: Self-pay | Admitting: Cardiovascular Disease

## 2013-12-27 ENCOUNTER — Encounter: Payer: Self-pay | Admitting: Cardiovascular Disease

## 2014-01-01 ENCOUNTER — Ambulatory Visit (HOSPITAL_COMMUNITY)
Admission: RE | Admit: 2014-01-01 | Discharge: 2014-01-01 | Disposition: A | Discharge status: Home / Self Care | Payer: 59 | Source: Ambulatory Visit | Attending: Cardiovascular Disease | Admitting: Cardiovascular Disease

## 2014-01-01 DIAGNOSIS — I739 Peripheral vascular disease, unspecified: Secondary | ICD-10-CM | POA: Insufficient documentation

## 2014-01-01 NOTE — Progress Notes (Signed)
Lower Extremity Arterial Duplex Completed. °Brianna L Mazza,RVT °

## 2014-01-08 ENCOUNTER — Telehealth: Payer: Self-pay | Admitting: Cardiovascular Disease

## 2014-01-08 NOTE — Telephone Encounter (Signed)
Returning call from yesterday.  Has been out of town.  Calling back to get test results. Please call

## 2014-01-08 NOTE — Telephone Encounter (Signed)
Pt called back and verified x 2.  Results given per MD (Result Note).  Pt verbalized understanding and agreed w/ plan.  Will f/u with PCP r/t neuropathy.

## 2014-01-08 NOTE — Telephone Encounter (Signed)
Returned call.  Left message to call back before 4pm.  Per result note, "No evidence of arterial blockage on either side. Symptoms are due to neuropathy."

## 2014-07-09 ENCOUNTER — Encounter: Payer: Self-pay | Admitting: Internal Medicine

## 2014-07-09 ENCOUNTER — Ambulatory Visit (INDEPENDENT_AMBULATORY_CARE_PROVIDER_SITE_OTHER): Payer: 59 | Admitting: Internal Medicine

## 2014-07-09 ENCOUNTER — Encounter (INDEPENDENT_AMBULATORY_CARE_PROVIDER_SITE_OTHER): Payer: Self-pay

## 2014-07-09 VITALS — BP 115/69 | HR 91 | Temp 97.9°F | Resp 20 | Ht 70.0 in | Wt 284.0 lb

## 2014-07-09 DIAGNOSIS — K2100 Gastro-esophageal reflux disease with esophagitis, without bleeding: Secondary | ICD-10-CM

## 2014-07-09 NOTE — Progress Notes (Signed)
Primary Care Physician:  Colette RibasGOLDING, JOHN CABOT, MD Primary Gastroenterologist:  Dr. Jena Gaussourk  Pre-Procedure History & Physical: HPI:  Stephen Alexander is a 48 y.o. male here for followup of GERD/erosive reflux esophagitis. Symptoms were well controlled on Protonix 40 mg daily. This medication not helping much any longer. No melena or dysphagia. He is using Nexium OTC with better results than a protonix.  No alcohol. Regular caffeinated/carbonated beverages. Recently started the Indocin and colchicine for gout. He has lost about 17 pounds eating less when the weather is warmer   Past Medical History  Diagnosis Date  . Back pain     Chronic discogenic   . Hypertension   . Atypical chest pain     history  . Hiatal hernia 2013    small  . GERD (gastroesophageal reflux disease)   . Hyperlipidemia   . DM (diabetes mellitus)     Past Surgical History  Procedure Laterality Date  . Colonoscopy    11/03/2005    Dr. Elmer Rampourk-Normal rectum, colon, terminal ileum.  . Colonoscopy  09/08/12    Dr. Jena Gaussourk- friable anal canal o/w normal rectum and colon  . Esophagogastroduodenoscopy  09/08/12    Dr. Jena Gaussourk- mild inflammatory changes of the distal esophagus consistent with erosive reflux esophagitits. small hiatal hernia  . [place] echocardiography  01/30/2010    normal  . Nm myocar perf wall motion  01/30/2010    normal, EF  51%    Prior to Admission medications   Medication Sig Start Date End Date Taking? Authorizing Provider  ALPRAZolam Prudy Feeler(XANAX) 0.5 MG tablet Take 0.5 mg by mouth at bedtime as needed.   Yes Historical Provider, MD  aspirin 81 MG tablet Take 81 mg by mouth daily.   Yes Historical Provider, MD  Cinnamon 500 MG TABS Take 500 mg by mouth daily.   Yes Historical Provider, MD  Coenzyme Q10 (CO Q-10) 300 MG CAPS Take 300 mg by mouth daily.   Yes Historical Provider, MD  cyclobenzaprine (FLEXERIL) 10 MG tablet Take 10 mg by mouth 3 (three) times daily as needed.   Yes Historical Provider, MD    ergocalciferol (VITAMIN D2) 50000 UNITS capsule Take 50,000 Units by mouth once a week.   Yes Historical Provider, MD  HYDROcodone-acetaminophen (NORCO) 10-325 MG per tablet Take 1 tablet by mouth every 6 (six) hours as needed for pain.   Yes Historical Provider, MD  losartan (COZAAR) 50 MG tablet Take 50 mg by mouth daily.   Yes Historical Provider, MD  nebivolol (BYSTOLIC) 5 MG tablet Take 5 mg by mouth daily.   Yes Historical Provider, MD  OMEGA-3 KRILL OIL 300 MG CAPS Take by mouth daily.   Yes Historical Provider, MD  Red Yeast Rice 600 MG TABS Take 600 mg by mouth 2 (two) times daily.   Yes Historical Provider, MD  pantoprazole (PROTONIX) 40 MG tablet Take 40 mg by mouth daily.    Historical Provider, MD    Allergies as of 07/09/2014  . (No Known Allergies)    Family History  Problem Relation Age of Onset  . Heart failure Father   . Hypertension Father     History   Social History  . Marital Status: Single    Spouse Name: N/A    Number of Children: N/A  . Years of Education: N/A   Occupational History  . Not on file.   Social History Main Topics  . Smoking status: Former Games developermoker  . Smokeless tobacco: Never Used  Comment: Socially  . Alcohol Use: No  . Drug Use: No  . Sexual Activity: Not on file   Other Topics Concern  . Not on file   Social History Narrative  . No narrative on file    Review of Systems: See HPI, otherwise negative ROS  Physical Exam: BP 115/69  Pulse 91  Temp(Src) 97.9 F (36.6 C) (Oral)  Resp 20  Ht 5\' 10"  (1.778 m)  Wt 284 lb (128.822 kg)  BMI 40.75 kg/m2 General:   Alert,  obese,well-nourished, pleasant and cooperative in NAD Skin:  Intact without significant lesions or rashes. Eyes:  Sclera clear, no icterus.   Conjunctiva pink. Ears:  Normal auditory acuity. Nose:  No deformity, discharge,  or lesions. Mouth:  No deformity or lesions. Neck:  Supple; no masses or thyromegaly. No significant cervical adenopathy. Lungs:   Clear throughout to auscultation.   No wheezes, crackles, or rhonchi. No acute distress. Heart:  Regular rate and rhythm; no murmurs, clicks, rubs,  or gallops. Abdomen: Non-distended, normal bowel sounds.  Soft and nontender without appreciable mass or hepatosplenomegaly.  Pulses:  Normal pulses noted. Extremities:  Without clubbing or edema.  Impression: 48 year old obese gentleman long-standing GERD. Worsening of symptoms recently. Protonix not working nearly as well. Nexium doing a bit better. No large symptoms at this time.    Recommendations:     GERD instructions/information provided  Stop Nexium; begin Dexilant 60 mg daily  -  samples provided  Office visit with me in one month; call in a week if you not starting to be much better on Dexilant.   Notice: This dictation was prepared with Dragon dictation along with smaller phrase technology. Any transcriptional errors that result from this process are unintentional and may not be corrected upon review.

## 2014-07-09 NOTE — Patient Instructions (Signed)
GERD instructions/information provided  Stop Nexium; begin Dexilant 60 mg daily  -  samples provided  Office visit with me in one month; call in a week if you not starting to be much better on Dexilant.

## 2014-07-26 ENCOUNTER — Encounter: Payer: Self-pay | Admitting: Cardiovascular Disease

## 2014-07-26 ENCOUNTER — Ambulatory Visit (INDEPENDENT_AMBULATORY_CARE_PROVIDER_SITE_OTHER): Payer: 59 | Admitting: Cardiovascular Disease

## 2014-07-26 VITALS — BP 118/72 | HR 77 | Ht 70.0 in | Wt 284.1 lb

## 2014-07-26 DIAGNOSIS — R739 Hyperglycemia, unspecified: Secondary | ICD-10-CM

## 2014-07-26 DIAGNOSIS — I10 Essential (primary) hypertension: Secondary | ICD-10-CM

## 2014-07-26 DIAGNOSIS — E785 Hyperlipidemia, unspecified: Secondary | ICD-10-CM

## 2014-07-26 DIAGNOSIS — G4733 Obstructive sleep apnea (adult) (pediatric): Secondary | ICD-10-CM

## 2014-07-26 DIAGNOSIS — R7309 Other abnormal glucose: Secondary | ICD-10-CM

## 2014-07-26 NOTE — Patient Instructions (Signed)
Your physician recommends that you schedule a follow-up appointment in: ONE YEAR with Dr.Croitoru  

## 2014-07-28 DIAGNOSIS — M109 Gout, unspecified: Secondary | ICD-10-CM | POA: Insufficient documentation

## 2014-07-28 NOTE — Progress Notes (Signed)
Patient ID: Stephen Alexander, male   DOB: 1966/09/22, 48 y.o.   MRN: [id number]      Reason for office visit HTN, hyperlipidemia, obstructive sleep apnea  Dennie is feeling generally well. He recently required treatment for gout. A few months ago he had some "flashes" of chest pain due to stress at work. These lasted for a split second and occurred at rest. He has some swelling of his legs intermittently and cramping of his calves in the middle of the night. He has worsening problems in his lumbar spine and bilateral knees. Lower extremity arterial Dopplers were normal in January of 2015. He recently had a gout attack. He saw Dr. 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  He continues to lose weight. He remains morbidly obese.   No Known Allergies  Current Outpatient Prescriptions  Medication Sig Dispense Refill  . ALPRAZolam (XANAX) 0.5 MG tablet Take 0.5 mg by mouth at bedtime as needed.      Marland Kitchen aspirin 81 MG tablet Take 81 mg by mouth daily.      . Cinnamon 500 MG TABS Take 500 mg by mouth daily.      . Coenzyme Q10 (CO Q-10) 300 MG CAPS Take 300 mg by mouth daily.      . colchicine 0.6 MG tablet Take 0.6 mg by mouth daily.      . cyclobenzaprine (FLEXERIL) 10 MG tablet Take 10 mg by mouth 3 (three) times daily as needed.      Marland Kitchen Dexlansoprazole 30 MG capsule Take 30 mg by mouth daily.      . ergocalciferol (VITAMIN D2) 50000 UNITS capsule Take 50,000 Units by mouth once a week.      Marland Kitchen Fexofenadine HCl (ALLEGRA PO) Take by mouth daily.      Marland Kitchen HYDROcodone-acetaminophen (NORCO) 10-325 MG per tablet Take 1 tablet by mouth every 6 (six) hours as needed for pain.      . INDOMETHACIN PO Take 3 tablets by mouth daily. For 10 days (gout)      . losartan (COZAAR) 50 MG tablet Take 50 mg by mouth daily.      . nebivolol (BYSTOLIC) 5 MG tablet Take 5 mg by mouth daily.      . OMEGA-3 KRILL OIL 300 MG CAPS Take by mouth daily.       . Red Yeast Rice 600 MG TABS Take 600 mg by mouth 2 (two) times daily.       No current facility-administered medications for this visit.    Past Medical History  Diagnosis Date  . Back pain     Chronic discogenic   . Hypertension   . Atypical chest pain     history  . Hiatal hernia 2013    small  . GERD (gastroesophageal reflux disease)   . Hyperlipidemia   . DM (diabetes mellitus)     Past Surgical History  Procedure Laterality Date  . Colonoscopy    11/03/2005    Dr. Elmer Ramp rectum, colon, terminal ileum.  . Colonoscopy  09/08/12    Dr. [name]- friable anal canal o/w normal rectum and colon  . Esophagogastroduodenoscopy  09/08/12    Dr. [name]- mild inflammatory changes of the distal esophagus consistent with erosive reflux esophagitits. small hiatal hernia  . US echocardiography  01/30/2010    normal  . Nm myocar perf wall motion  01/30/2010    normal, EF  51%  Family History  Problem Relation Age of Onset  . Heart failure Father   . Hypertension Father     History   Social History  . Marital Status: Single    Spouse Name: N/A    Number of Children: N/A  . Years of Education: N/A   Occupational History  . Not on file.   Social History Main Topics  . Smoking status: Former Games developer  . Smokeless tobacco: Never Used     Comment: Socially  . Alcohol Use: No  . Drug Use: No  . Sexual Activity: Not on file   Other Topics Concern  . Not on file   Social History Narrative  . No narrative on file    Review of systems: Musculoskeletal complaints as described above The patient specifically denies any sustained chest pain at rest or with exertion, dyspnea at rest or with exertion, orthopnea, paroxysmal nocturnal dyspnea, syncope, palpitations, focal neurological deficits, intermittent claudication, unexplained weight gain, cough, hemoptysis or wheezing.  The patient also denies abdominal pain, nausea, vomiting, dysphagia, diarrhea, constipation,  polyuria, polydipsia, dysuria, hematuria, frequency, urgency, abnormal bleeding or bruising, fever, chills, unexpected weight changes, mood swings, change in skin or hair texture, change in voice quality, auditory or visual problems, allergic reactions or rashes, new musculoskeletal complaints other than usual "aches and pains".   PHYSICAL EXAM BP 118/72  Pulse 77  Ht 5\' 10"  (1.778 m)  Wt 284 lb 1.6 oz (128.867 kg)  BMI 40.76 kg/m2 General: Alert, oriented x3, no distress, morbidly obese  Head: no evidence of trauma, PERRL, EOMI, no exophtalmos or lid lag, no myxedema, no xanthelasma; normal ears, nose and oropharynx  Neck: normal jugular venous pulsations and no hepatojugular reflux; brisk carotid pulses without delay and no carotid bruits  Chest: clear to auscultation, no signs of consolidation by percussion or palpation, normal fremitus, symmetrical and full respiratory excursions  Cardiovascular: normal position and quality of the apical impulse, regular rhythm, normal first and second heart sounds, no murmurs, rubs or gallops  Abdomen: no tenderness or distention, no masses by palpation, no abnormal pulsatility or arterial bruits, normal bowel sounds, no hepatosplenomegaly  Extremities: no clubbing, cyanosis or edema; 2+ radial, ulnar and brachial pulses bilaterally; 2+ right femoral, posterior tibial and dorsalis pedis pulses; 2+ left femoral, posterior tibial and dorsalis pedis pulses; no subclavian or femoral bruits  Neurological: grossly nonfocal   EKG: Normal sinus rhythm, normal tracing  Lipid Panel  2013 total cholesterol 156, triglycerides 139, HDL 32, LDL 96 BMET  Sodium 139, potassium 4.2, BUN 0.85, creatinine 0.6, normal liver function tests  Hemoglobin 14.9  Hemoglobin A1c 6.1%   ASSESSMENT AND PLAN  Although his weight loss is occurring at a very slow pace, I [employer] for the positive improvements he continues to make. As he loses more weight he should see  improvement in his lipid profile and may be able to cut back his antihypertensive medications. Weight loss will also yield benefit for his worsening back and knee problems.  Patient Instructions  Your physician recommends that you schedule a follow-up appointment in: ONE YEAR with Dr.Aeliana Spates      No orders of the defined types were placed in this encounter.   Meds ordered this encounter  Medications  . Dexlansoprazole 30 MG capsule    Sig: Take 30 mg by mouth daily.  . INDOMETHACIN PO    Sig: Take 3 tablets by mouth daily. For 10 days (gout)  . colchicine 0.6 MG tablet  Sig: Take 0.6 mg by mouth daily.  Marland Kitchen. Fexofenadine HCl (ALLEGRA PO)    Sig: Take by mouth daily.    Junious SilkROITORU,Willo Yoon  Jodye Scali, MD, [facility] [phone] office [phone] pager

## 2014-08-09 ENCOUNTER — Ambulatory Visit: Payer: 59 | Admitting: Internal Medicine

## 2014-08-27 ENCOUNTER — Encounter (INDEPENDENT_AMBULATORY_CARE_PROVIDER_SITE_OTHER): Payer: Self-pay

## 2014-08-27 ENCOUNTER — Ambulatory Visit (INDEPENDENT_AMBULATORY_CARE_PROVIDER_SITE_OTHER): Payer: 59 | Admitting: Internal Medicine

## 2014-08-27 ENCOUNTER — Encounter: Payer: Self-pay | Admitting: Internal Medicine

## 2014-08-27 VITALS — BP 121/71 | HR 83 | Temp 98.5°F | Ht 70.0 in | Wt 288.4 lb

## 2014-08-27 DIAGNOSIS — K219 Gastro-esophageal reflux disease without esophagitis: Secondary | ICD-10-CM

## 2014-08-27 MED ORDER — DEXLANSOPRAZOLE 60 MG PO CPDR
60.0000 mg | DELAYED_RELEASE_CAPSULE | Freq: Every day | ORAL | Status: DC
Start: 2014-08-27 — End: 2022-07-06

## 2014-08-27 NOTE — Progress Notes (Signed)
Primary Care Physician:  Colette Ribas, MD Primary Gastroenterologist:  Dr. [name]  Pre-Procedure History & Physical: HPI:  Stephen Alexander is a 48 y.o. male here for followup of GERD. Nexium no longer controlled his symptoms. Obesity and dietary indiscretion have predisposed him to refractory disease. Started on some Dexilant 60 mg orally daily which has been associated with marked improvement in his symptoms. He is now relatively asymptomatic unless he eats poorly. He continues to try losing weight with minimal success.  Past Medical History  Diagnosis Date  . Back pain     Chronic discogenic   . Hypertension   . Atypical chest pain     history  . Hiatal hernia 2013    small  . GERD (gastroesophageal reflux disease)   . Hyperlipidemia   . DM (diabetes mellitus)     Past Surgical History  Procedure Laterality Date  . Colonoscopy    11/03/2005    Dr. Elmer Ramp rectum, colon, terminal ileum.  . Colonoscopy  09/08/12    Dr. [name]- friable anal canal o/w normal rectum and colon  . Esophagogastroduodenoscopy  09/08/12    Dr. [name]- mild inflammatory changes of the distal esophagus consistent with erosive reflux esophagitits. small hiatal hernia  . US echocardiography  01/30/2010    normal  . Nm myocar perf wall motion  01/30/2010    normal, EF  51%    Prior to Admission medications   Medication Sig Start Date End Date Taking? Authorizing Provider  ALPRAZolam Prudy Feeler) 0.5 MG tablet Take 0.5 mg by mouth at bedtime as needed.   Yes Historical Provider, MD  aspirin 81 MG tablet Take 81 mg by mouth daily.   Yes Historical Provider, MD  Cinnamon 500 MG TABS Take 500 mg by mouth daily.   Yes Historical Provider, MD  Coenzyme Q10 (CO Q-10) 300 MG CAPS Take 300 mg by mouth daily.   Yes Historical Provider, MD  cyclobenzaprine (FLEXERIL) 10 MG tablet Take 10 mg by mouth 3 (three) times daily as needed.   Yes Historical Provider, MD  dexlansoprazole (DEXILANT) 60 MG capsule Take 60  mg by mouth daily.   Yes Historical Provider, MD  ergocalciferol (VITAMIN D2) 50000 UNITS capsule Take 50,000 Units by mouth once a week.   Yes Historical Provider, MD  Fexofenadine HCl (ALLEGRA PO) Take by mouth daily.   Yes Historical Provider, MD  HYDROcodone-acetaminophen (NORCO) 10-325 MG per tablet Take 1 tablet by mouth every 6 (six) hours as needed for pain.   Yes Historical Provider, MD  losartan (COZAAR) 50 MG tablet Take 50 mg by mouth daily.   Yes Historical Provider, MD  nebivolol (BYSTOLIC) 5 MG tablet Take 5 mg by mouth daily.   Yes Historical Provider, MD  OMEGA-3 KRILL OIL 300 MG CAPS Take by mouth daily.   Yes Historical Provider, MD  Red Yeast Rice 600 MG TABS Take 600 mg by mouth 2 (two) times daily.   Yes Historical Provider, MD  colchicine 0.6 MG tablet Take 0.6 mg by mouth daily.    Historical Provider, MD  dexlansoprazole (DEXILANT) 60 MG capsule Take 1 capsule (60 mg total) by mouth daily. 08/27/14   [name], MD    Allergies as of 08/27/2014  . (No Known Allergies)    Family History  Problem Relation Age of Onset  . Heart failure Father   . Hypertension Father     History   Social History  . Marital Status: Single  Spouse Name: N/A    Number of Children: N/A  . Years of Education: N/A   Occupational History  . Not on file.   Social History Main Topics  . Smoking status: Former Games developer  . Smokeless tobacco: Never Used     Comment: Socially  . Alcohol Use: No  . Drug Use: No  . Sexual Activity: Not on file   Other Topics Concern  . Not on file   Social History Narrative  . No narrative on file    Review of Systems: See HPI, otherwise negative ROS  Physical Exam: BP 121/71  Pulse 83  Temp(Src) 98.5 F (36.9 C) (Oral)  Ht  (1.778 m)  Wt 288 lb 6.4 oz (130.817 kg)  BMI 41.38 kg/m2 General:   Alert,  Well-developed, well-nourished, pleasant and cooperative in NAD Skin:  Intact without significant lesions or rashes. Eyes:   Sclera clear, no icterus.   Conjunctiva pink. Ears:  Normal auditory acuity. Nose:  No deformity, discharge,  or lesions. Mouth:  No deformity or lesions. Neck:  Supple; no masses or thyromegaly. No significant cervical adenopathy. Lungs:  Clear throughout to auscultation.   No wheezes, crackles, or rhonchi. No acute distress. Heart:  Regular rate and rhythm; no murmurs, clicks, rubs,  or gallops. Abdomen: Non-distended, normal bowel sounds.  Soft and nontender without appreciable mass or hepatosplenomegaly.  Pulses:  Normal pulses noted. Extremities:  Without clubbing or edema.  Impression:  48 year old morbidly obese gentleman with GERD refractory to Nexium now much better with Dexilant 60 mg daily. No alarm symptoms. Discussed the multipronged approach to the management of GERD.  Recommendations:  Continue Dexilant 60 mg daily  Weight loss  GERD information provided  Office visit in 3 months    Notice: This dictation was prepared with Dragon dictation along with smaller phrase technology. Any transcriptional errors that result from this process are unintentional and may not be corrected upon review.

## 2014-08-27 NOTE — Patient Instructions (Signed)
Continue Dexilant 60 mg daily  GERD information provided  Office visit in 3 months

## 2014-10-14 ENCOUNTER — Encounter: Payer: Self-pay | Admitting: Internal Medicine

## 2014-12-17 ENCOUNTER — Encounter: Payer: Self-pay | Admitting: Internal Medicine

## 2014-12-17 ENCOUNTER — Ambulatory Visit (INDEPENDENT_AMBULATORY_CARE_PROVIDER_SITE_OTHER): Payer: 59 | Admitting: Internal Medicine

## 2014-12-17 VITALS — BP 121/79 | HR 86 | Temp 97.5°F | Ht 70.0 in | Wt 288.4 lb

## 2014-12-17 DIAGNOSIS — K219 Gastro-esophageal reflux disease without esophagitis: Secondary | ICD-10-CM

## 2014-12-17 NOTE — Progress Notes (Signed)
Primary Care Physician:  Colette RibasGOLDING, JOHN CABOT, MD Primary Gastroenterologist:  Dr. Jena Gaussourk  Pre-Procedure History & Physical: HPI:  Stephen Alexander is a 48 y.o. male here for followup of GERD. Now on Dexilant 60 mg daily. Having only  a couple of breakthrough episodes weekly for which he takes [place]. Took Z-Pak and an injectable antibiotic a month ago for a URI and developed some abdominal cramps and loose stools -  self limiting. He started Metamucil on his own. He is feeling better at this time. Dysphagia, melena or other GI symptoms. Weight stable at 288.  Past Medical History  Diagnosis Date  . Back pain     Chronic discogenic   . Hypertension   . Atypical chest pain     history  . Hiatal hernia 2013    small  . GERD (gastroesophageal reflux disease)   . Hyperlipidemia   . DM (diabetes mellitus)     Past Surgical History  Procedure Laterality Date  . Colonoscopy    11/03/2005    Dr. Elmer Rampourk-Normal rectum, colon, terminal ileum.  . Colonoscopy  09/08/12    Dr. Jena Gaussourk- friable anal canal o/w normal rectum and colon  . Esophagogastroduodenoscopy  09/08/12    Dr. Jena Gaussourk- mild inflammatory changes of the distal esophagus consistent with erosive reflux esophagitits. small hiatal hernia  . [place] echocardiography  01/30/2010    normal  . Nm myocar perf wall motion  01/30/2010    normal, EF  51%    Prior to Admission medications   Medication Sig Start Date End Date Taking? Authorizing Provider  ALPRAZolam Prudy Feeler(XANAX) 0.5 MG tablet Take 0.5 mg by mouth at bedtime as needed.   Yes Historical Provider, MD  aspirin 81 MG tablet Take 81 mg by mouth daily.   Yes Historical Provider, MD  Cinnamon 500 MG TABS Take 500 mg by mouth daily.   Yes Historical Provider, MD  Coenzyme Q10 (CO Q-10) 300 MG CAPS Take 300 mg by mouth daily.   Yes Historical Provider, MD  cyclobenzaprine (FLEXERIL) 10 MG tablet Take 10 mg by mouth 3 (three) times daily as needed.   Yes Historical Provider, MD  dexlansoprazole  (DEXILANT) 60 MG capsule Take 60 mg by mouth daily.   Yes Historical Provider, MD  ergocalciferol (VITAMIN D2) 50000 UNITS capsule Take 50,000 Units by mouth once a week.   Yes Historical Provider, MD  Fexofenadine HCl (ALLEGRA PO) Take by mouth daily.   Yes Historical Provider, MD  HYDROcodone-acetaminophen (NORCO) 10-325 MG per tablet Take 1 tablet by mouth every 6 (six) hours as needed for pain.   Yes Historical Provider, MD  losartan (COZAAR) 50 MG tablet Take 50 mg by mouth daily.   Yes Historical Provider, MD  nebivolol (BYSTOLIC) 5 MG tablet Take 5 mg by mouth daily.   Yes Historical Provider, MD  OMEGA-3 KRILL OIL 300 MG CAPS Take by mouth daily.   Yes Historical Provider, MD  Red Yeast Rice 600 MG TABS Take 600 mg by mouth 2 (two) times daily.   Yes Historical Provider, MD  colchicine 0.6 MG tablet Take 0.6 mg by mouth daily.    Historical Provider, MD  dexlansoprazole (DEXILANT) 60 MG capsule Take 1 capsule (60 mg total) by mouth daily. Patient not taking: Reported on 12/17/2014 08/27/14   Corbin Adeobert M Rolan Wrightsman, MD    Allergies as of 12/17/2014  . (No Known Allergies)    Family History  Problem Relation Age of Onset  . Heart failure Father   .  Hypertension Father     History   Social History  . Marital Status: Single    Spouse Name: N/A    Number of Children: N/A  . Years of Education: N/A   Occupational History  . Not on file.   Social History Main Topics  . Smoking status: Former Games developermoker  . Smokeless tobacco: Never Used     Comment: Socially  . Alcohol Use: No  . Drug Use: No  . Sexual Activity: Not on file   Other Topics Concern  . Not on file   Social History Narrative    Review of Systems: See HPI, otherwise negative ROS  Physical Exam: BP 121/79 mmHg  Pulse 86  Temp(Src) 97.5 F (36.4 C)  Ht 5\' 10"  (1.778 m)  Wt 288 lb 6.4 oz (130.817 kg)  BMI 41.38 kg/m2 General:   Alert,  Well-developed, well-nourished, pleasant and cooperative in NAD Skin:  Intact  without significant lesions or rashes. Eyes:  Sclera clear, no icterus.   Conjunctiva pink. Ears:  Normal auditory acuity. Nose:  No deformity, discharge,  or lesions. Mouth:  No deformity or lesions. Neck:  Supple; no masses or thyromegaly. No significant cervical adenopathy. Lungs:  Clear throughout to auscultation.   No wheezes, crackles, or rhonchi. No acute distress. Heart:  Regular rate and rhythm; no murmurs, clicks, rubs,  or gallops. Abdomen: obese. Positive bowel sounds. Soft and nontender without appreciable mass or organomegaly Pulses:  Normal pulses noted. Extremities:  Without clubbing or edema.  Impression:  48 year old obese gentleman with chronic GERD.  Dexilant 60 mg daily working fairly well at this time.  He has occasional evening breakthrough symptoms. Recent GI up he of fever likely secondary to disturbance of the normal intestinal flora with antibiotic treatment. Metamucil probably not having much impact at this time.  Recommendations:  Try Align daily  Continue Dexilant 60 mg daily  May use Pepsid Complete in the evening for breakthrough reflux symptoms  Office visit in 9 months   Notice: This dictation was prepared with Dragon dictation along with smaller phrase technology. Any transcriptional errors that result from this process are unintentional and may not be corrected upon review.

## 2015-07-29 ENCOUNTER — Encounter: Payer: Self-pay | Admitting: Internal Medicine

## 2017-12-13 ENCOUNTER — Ambulatory Visit (HOSPITAL_COMMUNITY)
Admission: RE | Admit: 2017-12-13 | Discharge: 2017-12-13 | Disposition: A | Discharge status: Home / Self Care | Payer: 59 | Source: Ambulatory Visit | Attending: Family Medicine | Admitting: Family Medicine

## 2017-12-13 ENCOUNTER — Other Ambulatory Visit (HOSPITAL_COMMUNITY): Payer: Self-pay | Admitting: Family Medicine

## 2017-12-13 DIAGNOSIS — J069 Acute upper respiratory infection, unspecified: Secondary | ICD-10-CM | POA: Insufficient documentation

## 2017-12-13 DIAGNOSIS — I517 Cardiomegaly: Secondary | ICD-10-CM | POA: Insufficient documentation

## 2017-12-13 DIAGNOSIS — R918 Other nonspecific abnormal finding of lung field: Secondary | ICD-10-CM | POA: Diagnosis not present

## 2019-03-03 IMAGING — DX DG CHEST 2V
2 series · 2 of 2 positions shown · non-contrast
Comparison: 12/25/2009.

CLINICAL DATA: Respiratory tract infection.  Cough and congestion.

EXAM:
CHEST  2 VIEW

[chest pa]
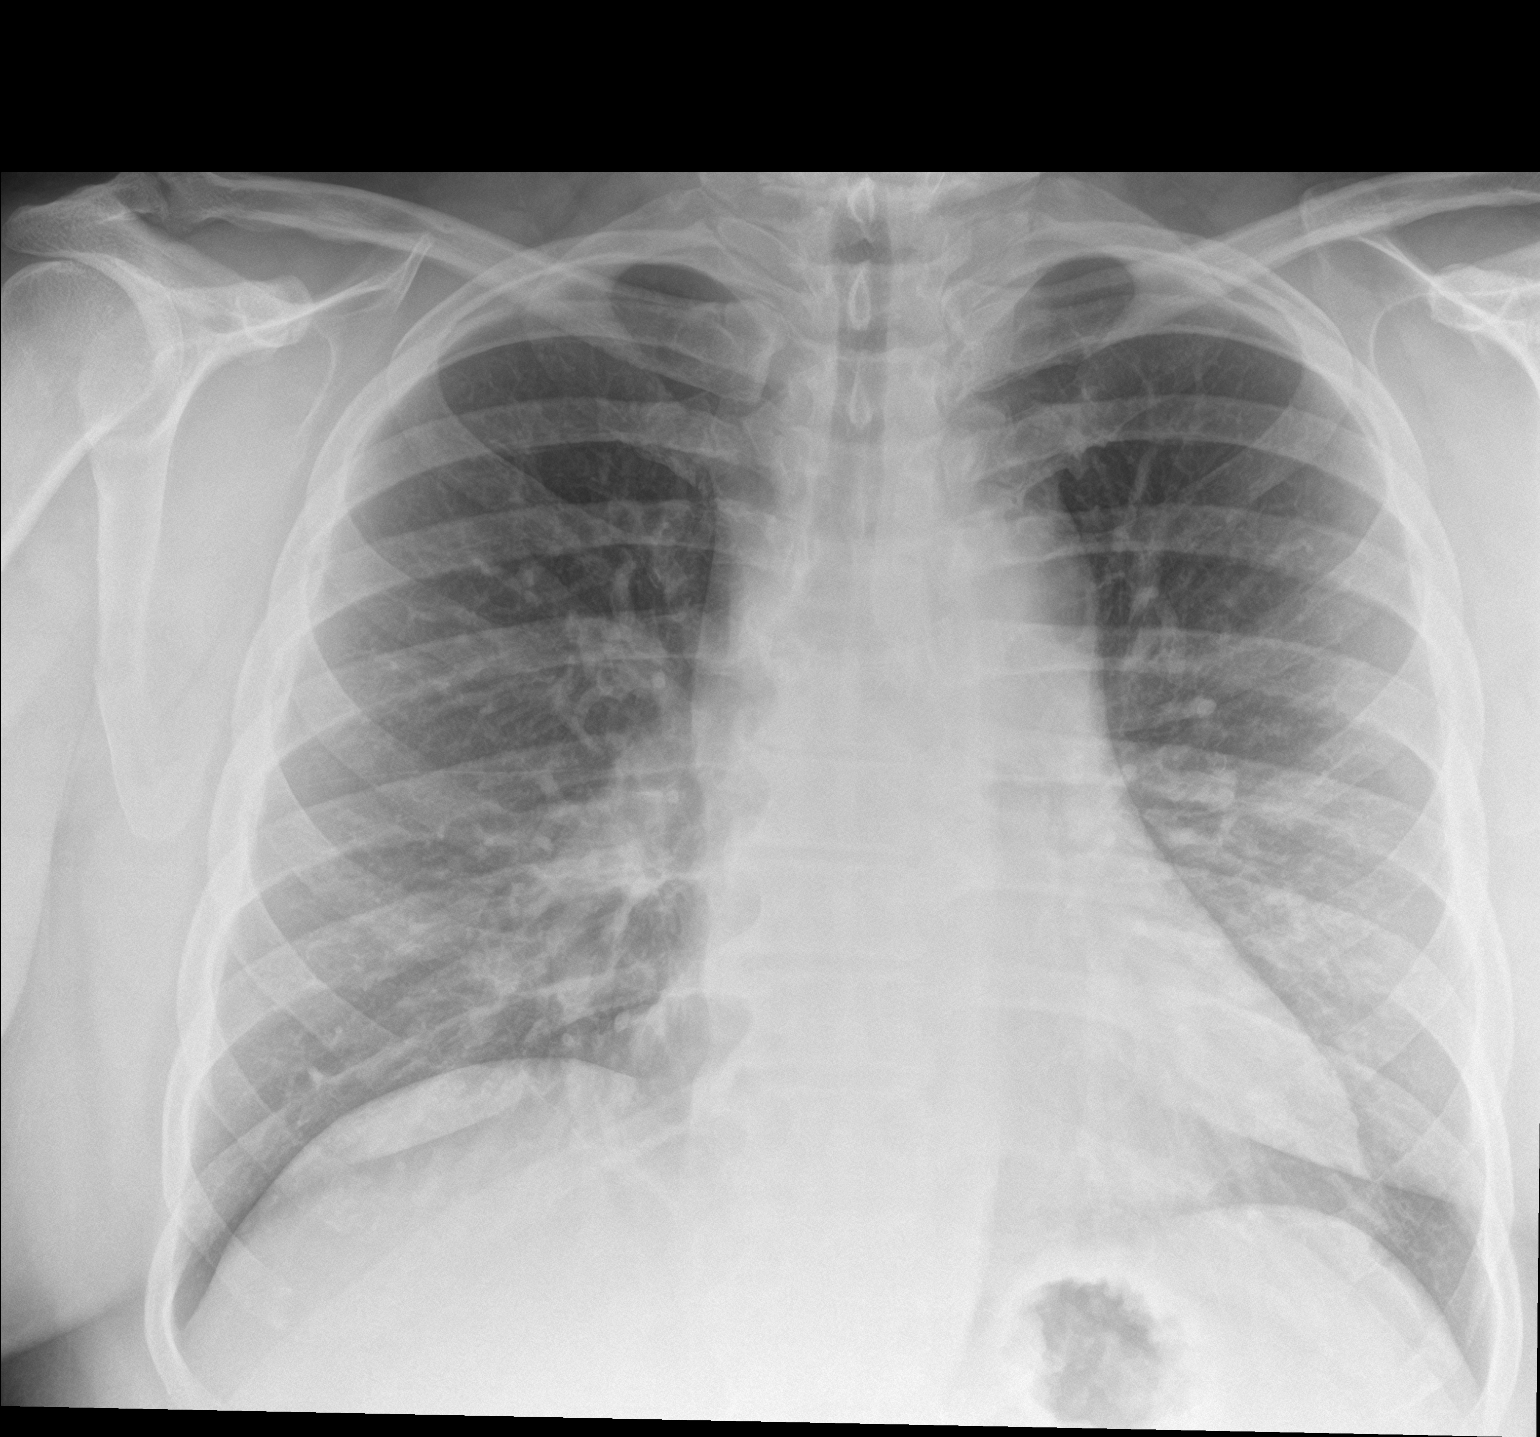

[chest lat]
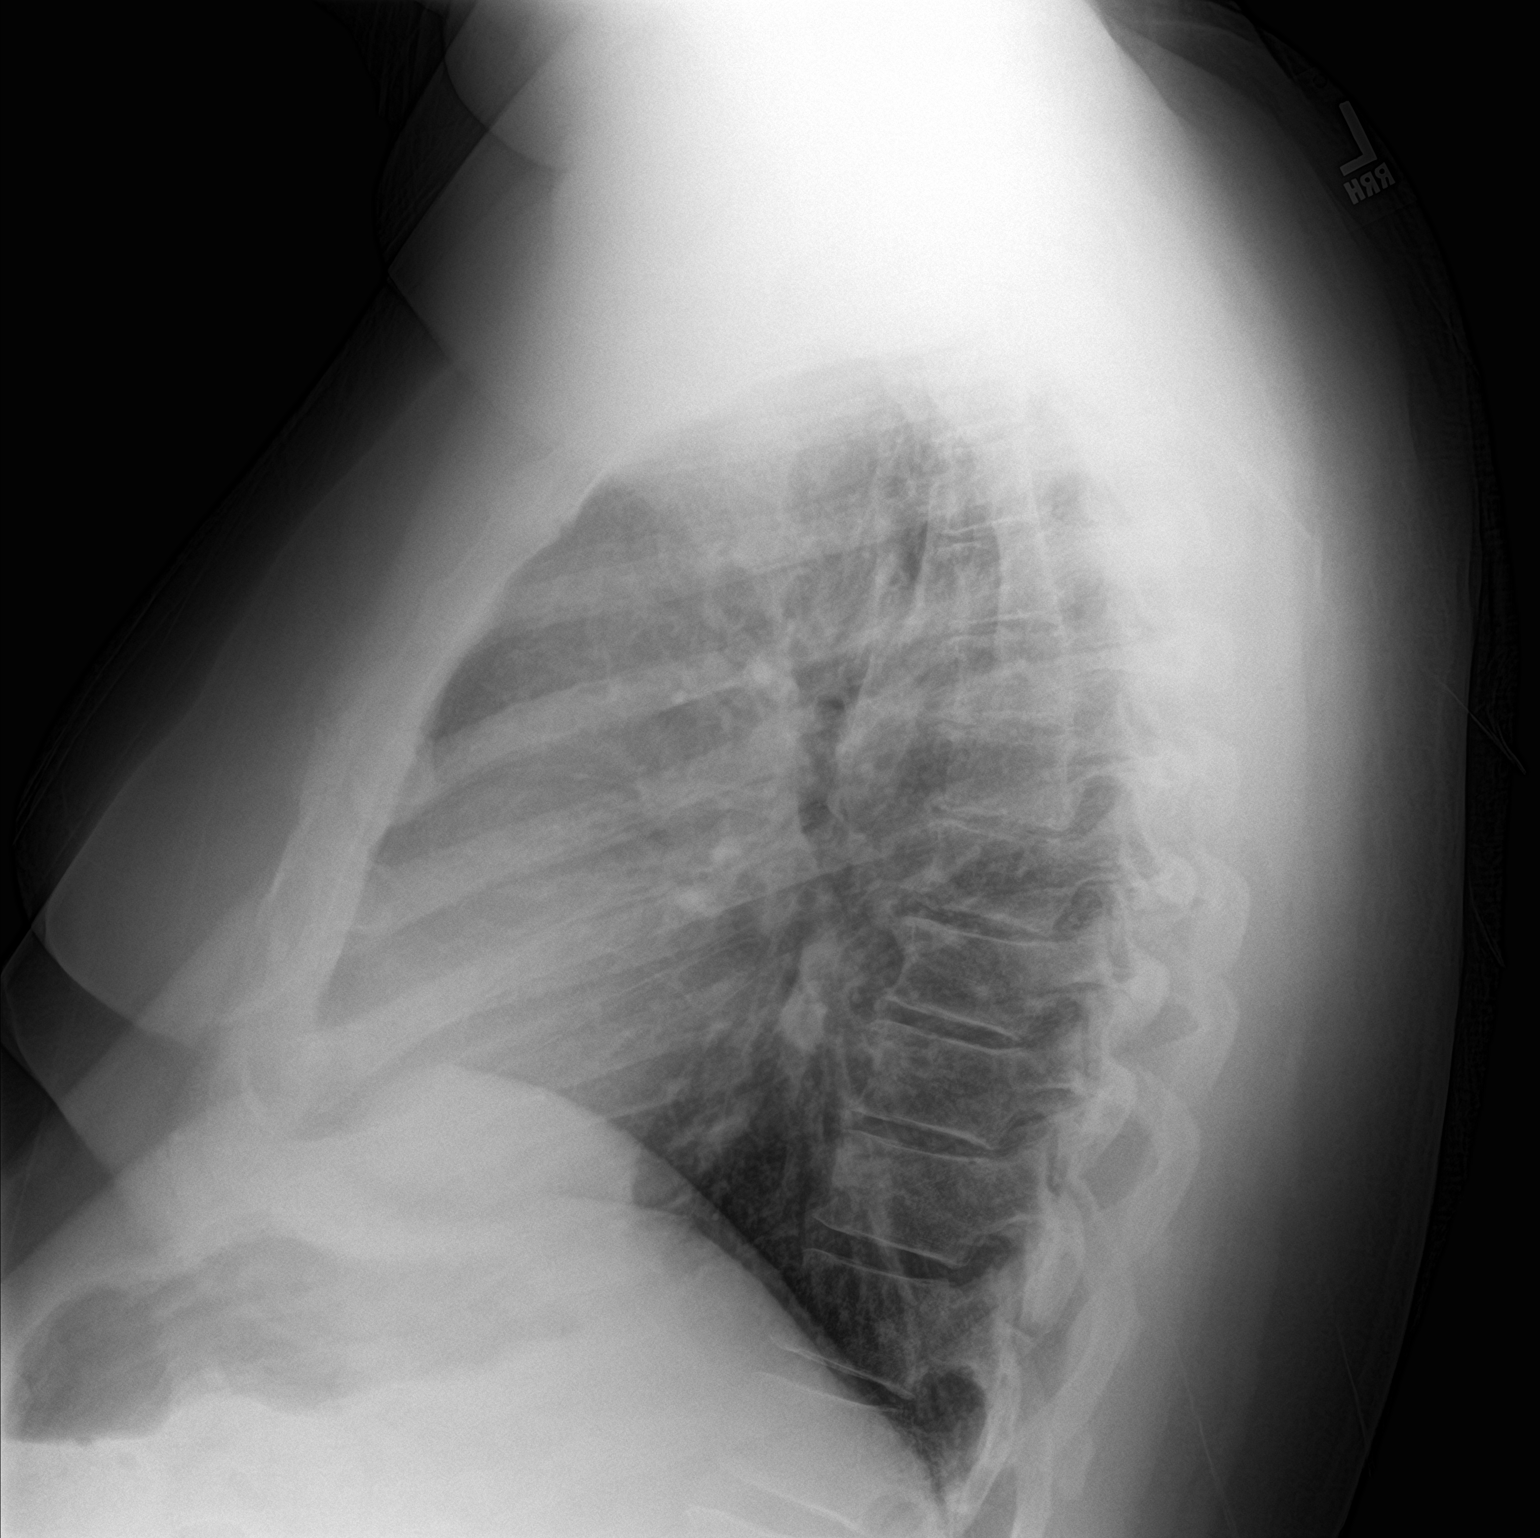

[2 of 2 positions shown; findings below may reference images not displayed]

FINDINGS: Mediastinum and hilar structures normal. Cardiomegaly. No focal
infiltrate. Minimal interstitial prominence noted bilaterally . No
pleural effusion or pneumothorax .
IMPRESSION: Cardiomegaly. Minimal interstitial prominence noted bilaterally.
Minimal interstitial edema and/or pneumonitis cannot be completely
excluded.

## 2022-06-16 NOTE — Progress Notes (Deleted)
NEW PATIENT NO SHOW - first episode

## 2022-06-17 ENCOUNTER — Inpatient Hospital Stay (HOSPITAL_COMMUNITY): Payer: 59 | Admitting: Hematology

## 2022-06-17 DIAGNOSIS — D72829 Elevated white blood cell count, unspecified: Secondary | ICD-10-CM | POA: Insufficient documentation

## 2022-07-05 NOTE — Progress Notes (Unsigned)
[place], [place] [address]   CLINIC:  Medical Oncology/Hematology  CONSULT NOTE  Patient Care Team: Sharilyn Sites, MD as PCP - General (Family Medicine) Gala Romney Cristopher Estimable, MD (Gastroenterology)  CHIEF COMPLAINTS/PURPOSE OF CONSULTATION:  Leukocytosis  HISTORY OF PRESENTING ILLNESS:  Stephen Alexander 56 y.o. male is here at the request of his PCP (Dr. Sharilyn Sites) for evaluation of leukocytosis.  Per PCP note from 04/05/2022, patient had labs checked by his place of employment, with WBC 11.5. CBC checked by PCP on 04/05/2022 shows WBC 10.9 with ANC 7.6.  Repeat CBC (05/18/2022) shows WBC 12.8 with ANC 9.8.  Hemoglobin and platelets were normal.  He uses clotrimazole-betamethasone cream as well as triamcinolone cream for ***, since 02/08/2022.  *** He is a former smoker, quit in 2001 after smoking *** PPD for *** years. Patient is obese, with BMI 38.07.  *** He is not on any steroid medications. *** No recent infections.  No history of connective tissue disorder or chronic inflammatory disease. *** He denies any B symptoms. *** Denies any nausea, vomiting, or diarrhea. Denies any new pains. *** He denies any changes in his bowel, bladder, or breathing habits. *** Had not noticed any recent bleeding such as epistaxis, hematuria or hematochezia. *** Denies any recent fevers, infections, or recent hospitalizations. *** No new masses or lymphadenopathy per his  report. *** Patient reports appetite at *** and energy level at ***. He is maintaining stable weight at this time.  PMH: Type 2 diabetes mellitus, chronic pain syndrome, obesity ***  *** SOCIAL HISTORY  *** FAMILY HISTORY     MEDICAL HISTORY:  Past Medical History:  Diagnosis Date   Atypical chest pain    history   Back pain    Chronic discogenic    DM (diabetes mellitus) ([facility])    GERD (gastroesophageal reflux disease)    Hiatal hernia 2013   small   Hyperlipidemia     Hypertension     SURGICAL HISTORY: Past Surgical History:  Procedure Laterality Date   COLONOSCOPY    11/03/2005   Dr. Vivi Ferns rectum, colon, terminal ileum.   COLONOSCOPY  09/08/12   Dr. Gala Romney- friable anal canal o/w normal rectum and colon   ESOPHAGOGASTRODUODENOSCOPY  09/08/12   Dr. Gala Romney- mild inflammatory changes of the distal esophagus consistent with erosive reflux esophagitits. small hiatal hernia   NM MYOCAR PERF WALL MOTION  01/30/2010   normal, EF  51%   US ECHOCARDIOGRAPHY  01/30/2010   normal    SOCIAL HISTORY: Social History   Socioeconomic History   Marital status: Single    Spouse name: Not on file   Number of children: Not on file   Years of education: Not on file   Highest education level: Not on file  Occupational History   Not on file  Tobacco Use   Smoking status: Former   Smokeless tobacco: Never   Tobacco comments:    Socially  Substance and Sexual Activity   Alcohol use: No   Drug use: No   Sexual activity: Not on file  Other Topics Concern   Not on file  Social History Narrative   Not on file   Social Determinants of Health   Financial Resource Strain: Not on file  Food Insecurity: Not on file  Transportation Needs: Not on file  Physical Activity: Not on file  Stress: Not on file  Social Connections: Not on file  Intimate Partner Violence: Not  on file    FAMILY HISTORY: Family History  Problem Relation Age of Onset   Heart failure Father    Hypertension Father     ALLERGIES:  has No Known Allergies.  MEDICATIONS:  Current Outpatient Medications  Medication Sig Dispense Refill   ALPRAZolam (XANAX) 0.5 MG tablet Take 0.5 mg by mouth at bedtime as needed.     aspirin 81 MG tablet Take 81 mg by mouth daily.     Cinnamon 500 MG TABS Take 500 mg by mouth daily.     Coenzyme Q10 (CO Q-10) 300 MG CAPS Take 300 mg by mouth daily.     colchicine 0.6 MG tablet Take 0.6 mg by mouth daily.     cyclobenzaprine (FLEXERIL) 10 MG tablet  Take 10 mg by mouth 3 (three) times daily as needed.     dexlansoprazole (DEXILANT) 60 MG capsule Take 60 mg by mouth daily.     dexlansoprazole (DEXILANT) 60 MG capsule Take 1 capsule (60 mg total) by mouth daily. (Patient not taking: Reported on 12/17/2014) 30 capsule 6   ergocalciferol (VITAMIN D2) 50000 UNITS capsule Take 50,000 Units by mouth once a week.     Fexofenadine HCl (ALLEGRA PO) Take by mouth daily.     HYDROcodone-acetaminophen (NORCO) 10-325 MG per tablet Take 1 tablet by mouth every 6 (six) hours as needed for pain.     losartan (COZAAR) 50 MG tablet Take 50 mg by mouth daily.     nebivolol (BYSTOLIC) 5 MG tablet Take 5 mg by mouth daily.     OMEGA-3 KRILL OIL 300 MG CAPS Take by mouth daily.     Red Yeast Rice 600 MG TABS Take 600 mg by mouth 2 (two) times daily.     No current facility-administered medications for this visit.    REVIEW OF SYSTEMS:   Review of Systems - Oncology    PHYSICAL EXAMINATION: ECOG PERFORMANCE STATUS: {CHL ONC ECOG PS:[id number]}  There were no vitals filed for this visit. There were no vitals filed for this visit.  Physical Exam    LABORATORY DATA:  I have reviewed the data as listed No results found for this or any previous visit (from the past 2160 hour(s)).  RADIOGRAPHIC STUDIES: I have personally reviewed the radiological images as listed and agreed with the findings in the report. No results found.   ASSESSMENT & PLAN: 1.  Neutrophilic leukocytosis - Seen at the request of his PCP (Dr. Sharilyn Sites) for evaluation of leukocytosis. - Per PCP note from 04/05/2022, patient had labs checked by his place of employment, with WBC 11.5.  CBC checked by PCP on 04/05/2022 shows WBC 10.9 with ANC 7.6.  Repeat CBC (05/18/2022) shows WBC 12.8 with ANC 9.8.  Hemoglobin and platelets were normal. - *** - He uses clotrimazole-betamethasone cream as well as triamcinolone cream for ***, since 02/08/2022.  *** - He is a former smoker, quit in  2001 after smoking *** PPD for *** years. - Patient is obese, with BMI 38.07. - *** - PLAN: We will check JAK2 with reflex, BCR/ABL FISH, ESR, CRP, rheumatoid factor, and ANA. - RTC in 2 to 3 weeks to discuss results.  ***  2.  Other history - ***   PLAN SUMMARY & DISPOSITION: ***  All questions were answered. The patient knows to call the clinic with any problems, questions or concerns.   Medical decision making: ***  Time spent on visit: I spent {CHL ONC TIME VISIT - QIONG:[id number]} counseling the  patient face to face. The total time spent in the appointment was {CHL ONC TIME VISIT - JIRCV:[id number]} and more than 50% was on counseling.  I, Tarri Abernethy PA-C, have seen this patient in conjunction with Dr. Derek Jack.  Greater than 50% of visit was performed by Dr. Delton Coombes.   Harriett Rush, PA-C *** ***  DR. Delton CoombesMarland Kitchen

## 2022-07-06 ENCOUNTER — Inpatient Hospital Stay (HOSPITAL_COMMUNITY): Payer: 59 | Attending: Hematology | Admitting: Hematology

## 2022-07-06 ENCOUNTER — Inpatient Hospital Stay (HOSPITAL_COMMUNITY): Payer: 59

## 2022-07-06 VITALS — BP 122/73 | HR 69 | Temp 98.0°F | Resp 18 | Ht 70.0 in | Wt 240.0 lb

## 2022-07-06 DIAGNOSIS — E119 Type 2 diabetes mellitus without complications: Secondary | ICD-10-CM | POA: Diagnosis not present

## 2022-07-06 DIAGNOSIS — Z87891 Personal history of nicotine dependence: Secondary | ICD-10-CM | POA: Insufficient documentation

## 2022-07-06 DIAGNOSIS — D72828 Other elevated white blood cell count: Secondary | ICD-10-CM | POA: Diagnosis not present

## 2022-07-06 DIAGNOSIS — Z7984 Long term (current) use of oral hypoglycemic drugs: Secondary | ICD-10-CM | POA: Diagnosis not present

## 2022-07-06 DIAGNOSIS — E785 Hyperlipidemia, unspecified: Secondary | ICD-10-CM | POA: Diagnosis not present

## 2022-07-06 DIAGNOSIS — Z79899 Other long term (current) drug therapy: Secondary | ICD-10-CM | POA: Diagnosis not present

## 2022-07-06 DIAGNOSIS — I10 Essential (primary) hypertension: Secondary | ICD-10-CM | POA: Diagnosis not present

## 2022-07-06 DIAGNOSIS — K219 Gastro-esophageal reflux disease without esophagitis: Secondary | ICD-10-CM | POA: Diagnosis not present

## 2022-07-06 DIAGNOSIS — D72829 Elevated white blood cell count, unspecified: Secondary | ICD-10-CM | POA: Diagnosis not present

## 2022-07-06 LAB — CBC WITH DIFFERENTIAL/PLATELET
Abs Immature Granulocytes: 0.03 10*3/uL (ref 0.00–0.07)
Basophils Absolute: 0.1 10*3/uL (ref 0.0–0.1)
Basophils Relative: 1 %
Eosinophils Absolute: 0.2 10*3/uL (ref 0.0–0.5)
Eosinophils Relative: 2 %
HCT: 40.9 % (ref 39.0–52.0)
Hemoglobin: 13.6 g/dL (ref 13.0–17.0)
Immature Granulocytes: 0 %
Lymphocytes Relative: 18 %
Lymphs Abs: 2 10*3/uL (ref 0.7–4.0)
MCH: 29.7 pg (ref 26.0–34.0)
MCHC: 33.3 g/dL (ref 30.0–36.0)
MCV: 89.3 fL (ref 80.0–100.0)
Monocytes Absolute: 0.7 10*3/uL (ref 0.1–1.0)
Monocytes Relative: 7 %
Neutro Abs: 7.7 10*3/uL (ref 1.7–7.7)
Neutrophils Relative %: 72 %
Platelets: 257 10*3/uL (ref 150–400)
RBC: 4.58 MIL/uL (ref 4.22–5.81)
RDW: 13.7 % (ref 11.5–15.5)
WBC: 10.7 10*3/uL — ABNORMAL HIGH (ref 4.0–10.5)
nRBC: 0 % (ref 0.0–0.2)

## 2022-07-06 LAB — LACTATE DEHYDROGENASE: LDH: 102 U/L (ref 98–192)

## 2022-07-06 LAB — C-REACTIVE PROTEIN: CRP: 1.2 mg/dL — ABNORMAL HIGH (ref <1.0)

## 2022-07-06 LAB — SEDIMENTATION RATE: Sed Rate: 27 mm/hr — ABNORMAL HIGH (ref 0–16)

## 2022-07-06 NOTE — Patient Instructions (Signed)
[facility] at [facility] Discharge Instructions  You were seen today by Dr. Ellin Saba & Rojelio Brenner PA-C for your elevated white blood cells.  We do not yet know the exact cause of your high white blood cells, but I suspect that it is "reactive" from your steroid cream and inflammatory conditions.  We will check additional labs today to investigate possible cause of your high white blood cells..    FOLLOW-UP APPOINTMENT: Office visit in 3 weeks to discuss lab results.   Thank you for choosing [facility] at [facility] to provide your oncology and hematology care.  To afford each patient quality time with our provider, please arrive at least 15 minutes before your scheduled appointment time.   If you have a lab appointment with the Cancer Center please come in thru the Main Entrance and check in at the main information desk.  You need to re-schedule your appointment should you arrive 10 or more minutes late.  We strive to give you quality time with our providers, and arriving late affects you and other patients whose appointments are after yours.  Also, if you no show three or more times for appointments you may be dismissed from the clinic at the providers discretion.     Again, thank you for choosing [facility].  Our hope is that these requests will decrease the amount of time that you wait before being seen by our physicians.       _____________________________________________________________  Should you have questions after your visit to [facility], please contact our office at [phone] and follow the prompts.  Our office hours are 8:00 a.m. and 4:30 p.m. Monday - Friday.  Please note that voicemails left after 4:00 p.m. may not be returned until the following business day.  We are closed weekends and major holidays.  You do have access to a nurse 24-7, just call the main number to the clinic [phone] and  do not press any options, hold on the line and a nurse will answer the phone.    For prescription refill requests, have your pharmacy contact our office and allow 72 hours.    Due to Covid, you will need to wear a mask upon entering the hospital. If you do not have a mask, a mask will be given to you at the Main Entrance upon arrival. For doctor visits, patients may have 1 support person age 21 or older with them. For treatment visits, patients can not have anyone with them due to social distancing guidelines and our immunocompromised population.

## 2022-07-07 LAB — RHEUMATOID FACTOR: Rheumatoid fact SerPl-aCnc: 10.3 IU/mL (ref <14.0)

## 2022-07-07 LAB — ANA: Anti Nuclear Antibody (ANA): NEGATIVE

## 2022-07-12 LAB — BCR-ABL1 FISH
Cells Analyzed: 200
Cells Counted: 200

## 2022-07-14 LAB — CALR +MPL + E12-E15  (REFLEX)

## 2022-07-14 LAB — JAK2 V617F RFX CALR/MPL/E12-15

## 2022-07-25 NOTE — Progress Notes (Unsigned)
[facility] [address], [place] [address]   CLINIC:  Medical Oncology/Hematology  PCP:  [name], MD [address] [place] [address] [phone]   REASON FOR VISIT:  Follow-up for leukocytosis  PRIOR THERAPY: None  CURRENT THERAPY: Under work-up  INTERVAL HISTORY:  Stephen Alexander 56 y.o. male returns for routine follow-up of his leukocytosis.  He was seen for initial consultation by Dr. Ellin Saba and Rojelio Brenner PA-C on 07/06/2022.  At today's visit, he reports feeling well.  He denies any changes in his symptoms or health status since his initial visit 3 weeks ago.  No B symptoms such as fever, chills, night sweats, unintentional weight loss.  Denies any nausea, vomiting, or diarrhea. Denies any new pains.  He denies any changes in his bowel, bladder, or breathing habits. No new masses or lymphadenopathy per his  report.  Patient reports appetite at  50% and energy level at  75%. He has been losing weight since starting Mounjaro for his diabetes.   REVIEW OF SYSTEMS:    Review of Systems  Constitutional:  Positive for fatigue. Negative for appetite change, chills, diaphoresis, fever and unexpected weight change.  HENT:   Negative for lump/mass and nosebleeds.   Eyes:  Negative for eye problems.  Respiratory:  Negative for cough, hemoptysis and shortness of breath.   Cardiovascular:  Negative for chest pain, leg swelling and palpitations.  Gastrointestinal:  Positive for constipation. Negative for abdominal pain, blood in stool, diarrhea, nausea and vomiting.  Genitourinary:  Negative for hematuria.   Musculoskeletal:  Positive for arthralgias.  Skin: Negative.   Neurological:  Negative for dizziness, headaches and light-headedness.  Hematological:  Does not bruise/bleed easily.     PAST MEDICAL/SURGICAL HISTORY:  Past Medical History:  Diagnosis Date   Atypical chest pain    history   Back pain    Chronic discogenic    DM  (diabetes mellitus) ([facility])    GERD (gastroesophageal reflux disease)    Hiatal hernia 2013   small   Hyperlipidemia    Hypertension    Past Surgical History:  Procedure Laterality Date   COLONOSCOPY    11/03/2005   Dr. Elmer Ramp rectum, colon, terminal ileum.   COLONOSCOPY  09/08/12   Dr. [name]- friable anal canal o/w normal rectum and colon   ESOPHAGOGASTRODUODENOSCOPY  09/08/12   Dr. [name]- mild inflammatory changes of the distal esophagus consistent with erosive reflux esophagitits. small hiatal hernia   NM MYOCAR PERF WALL MOTION  01/30/2010   normal, EF  51%   US ECHOCARDIOGRAPHY  01/30/2010   normal     SOCIAL HISTORY:  Social History   Socioeconomic History   Marital status: Single    Spouse name: Not on file   Number of children: Not on file   Years of education: Not on file   Highest education level: Not on file  Occupational History   Not on file  Tobacco Use   Smoking status: Former   Smokeless tobacco: Never   Tobacco comments:    Socially  Substance and Sexual Activity   Alcohol use: No   Drug use: No   Sexual activity: Not on file  Other Topics Concern   Not on file  Social History Narrative   Not on file   Social Determinants of Health   Financial Resource Strain: Not on file  Food Insecurity: Not on file  Transportation Needs: Not on file  Physical Activity: Not on file  Stress:  Not on file  Social Connections: Not on file  Intimate Partner Violence: Not on file    FAMILY HISTORY:  Family History  Problem Relation Age of Onset   Heart failure Father    Hypertension Father     CURRENT MEDICATIONS:  Outpatient Encounter Medications as of 07/26/2022  Medication Sig   Cinnamon 500 MG TABS Take 500 mg by mouth daily.   Coenzyme Q10 (CO Q-10) 300 MG CAPS Take 300 mg by mouth daily.   cyclobenzaprine (FLEXERIL) 10 MG tablet Take 10 mg by mouth 3 (three) times daily as needed.   esomeprazole (NEXIUM) 20 MG capsule Take 20 mg by mouth daily at  12 noon.   Fexofenadine HCl (ALLEGRA PO) Take by mouth daily.   HYDROcodone-acetaminophen (NORCO) 10-325 MG per tablet Take 1 tablet by mouth every 6 (six) hours as needed for pain.   linagliptin (TRADJENTA) 5 MG TABS tablet Take 5 mg by mouth daily.   losartan (COZAAR) 50 MG tablet Take 50 mg by mouth daily.   metFORMIN (GLUCOPHAGE) 500 MG tablet Take by mouth 2 (two) times daily with a meal.   nebivolol (BYSTOLIC) 5 MG tablet Take 5 mg by mouth daily.   OMEGA-3 KRILL OIL 300 MG CAPS Take by mouth daily.   Red Yeast Rice 600 MG TABS Take 600 mg by mouth 2 (two) times daily.   tirzepatide (MOUNJARO) 10 MG/0.5ML Pen Inject 10 mg into the skin once a week.   zolpidem (AMBIEN) 10 MG tablet Take 10 mg by mouth at bedtime as needed for sleep.   No facility-administered encounter medications on file as of 07/26/2022.    ALLERGIES:  No Known Allergies   PHYSICAL EXAM:    ECOG PERFORMANCE STATUS: 1 - Symptomatic but completely ambulatory  There were no vitals filed for this visit. There were no vitals filed for this visit. Physical Exam Constitutional:      Appearance: Normal appearance. He is obese.  HENT:     Head: Normocephalic and atraumatic.     Mouth/Throat:     Mouth: Mucous membranes are moist.  Eyes:     Extraocular Movements: Extraocular movements intact.     Pupils: Pupils are equal, round, and reactive to light.  Cardiovascular:     Rate and Rhythm: Normal rate and regular rhythm.     Pulses: Normal pulses.     Heart sounds: Normal heart sounds.  Pulmonary:     Effort: Pulmonary effort is normal.     Breath sounds: Normal breath sounds.  Abdominal:     General: Bowel sounds are normal.     Palpations: Abdomen is soft.     Tenderness: There is no abdominal tenderness.  Musculoskeletal:        General: No swelling.     Right lower leg: No edema.     Left lower leg: No edema.  Lymphadenopathy:     Cervical: No cervical adenopathy.  Skin:    General: Skin is warm  and dry.  Neurological:     General: No focal deficit present.     Mental Status: He is alert and oriented to person, place, and time.  Psychiatric:        Mood and Affect: Mood normal.        Behavior: Behavior normal.     LABORATORY DATA:  I have reviewed the labs as listed.  CBC    Component Value Date/Time   WBC 10.7 (H) 07/06/2022 1041   RBC 4.58 07/06/2022 1041  HGB 13.6 07/06/2022 1041   HCT 40.9 07/06/2022 1041   PLT 257 07/06/2022 1041   MCV 89.3 07/06/2022 1041   MCH 29.7 07/06/2022 1041   MCHC 33.3 07/06/2022 1041   RDW 13.7 07/06/2022 1041   LYMPHSABS 2.0 07/06/2022 1041   MONOABS 0.7 07/06/2022 1041   EOSABS 0.2 07/06/2022 1041   BASOSABS 0.1 07/06/2022 1041      Latest Ref Rng & Units 12/26/2009    2:34 AM 12/25/2009    8:27 PM  CMP  Glucose 70 - 99 mg/dL 121  137   BUN 6 - 23 mg/dL 8  9   Creatinine 0.4 - 1.5 mg/dL 0.71  0.71   Sodium 135 - 145 mEq/L 137  136   Potassium 3.5 - 5.1 mEq/L 4.0  3.8   Chloride 96 - 112 mEq/L 103  98   CO2 19 - 32 mEq/L 28  26   Calcium 8.4 - 10.5 mg/dL 9.6  9.7     DIAGNOSTIC IMAGING:  I have independently reviewed the relevant imaging and discussed with the patient.  ASSESSMENT & PLAN: 1.  Neutrophilic leukocytosis - Seen at the request of his PCP (Dr. Sharilyn Sites) for evaluation of leukocytosis. - Per PCP note from 04/05/2022, patient had labs checked by his place of employment, with WBC 11.5.  CBC checked by PCP on 04/05/2022 shows WBC 10.9 with ANC 7.6.  Repeat CBC (05/18/2022) shows WBC 12.8 with ANC 9.8.  Hemoglobin and platelets were normal. - No autoimmune connective tissue disorders.  PMH significant for gout, diabetes, and obesity. - He uses clotrimazole-betamethasone cream as well as triamcinolone cream for intermittently for skin rash for many years.  Requires occasional oral steroids for his degenerative disc disease. - He is a former smoker, quit in 2001 after smoking <0.25 PPD for 12 years. - Hematology  work-up (07/06/2022): JAK2 with reflex to CALR, MPL, and E12-15 was negative.  Negative BCR/ABL. Elevated ESR at 27, elevated CRP 1.2.  Negative ANA, RF, LDH. - Most recent CBC (07/06/2022): WBC 10.7 with normal differential, otherwise normal CBC. - No B symptoms such as fever, chills, night sweats, unintentional weight loss. - No palpable lymphadenopathy on exam. - PLAN: Suspect reactive leukocytosis.  We will repeat CBC with RTC in 6 months. - If clinical stability x1 year, we will consider discharge at that time.      2.  Other history - PMH: Type 2 diabetes mellitus, chronic pain from degenerative disc disease (L4/L5 disc herniation), obesity, hypertension, and gout. - SOCIAL: Patient works in [employer].  He is a former smoker, quit in 2001, with 3-pack-year history.  He reports history of heavy alcohol consumption, quit in 1998.  He denies any history of illicit drug use. - FAMILY: Family history is negative for any blood disorders.  Patient's maternal grandmother and maternal uncle both had colon cancer.   All questions were answered. The patient knows to call the clinic with any problems, questions or concerns.  Medical decision making: Low  Time spent on visit: I spent 15 minutes counseling the patient face to face. The total time spent in the appointment was 25 minutes and more than 50% was on counseling.   Harriett Rush, PA-C  07/26/22 8:31 AM

## 2022-07-26 ENCOUNTER — Inpatient Hospital Stay (HOSPITAL_COMMUNITY): Payer: 59 | Admitting: Physician Assistant

## 2022-07-26 VITALS — BP 119/69 | HR 87 | Temp 98.5°F | Resp 16 | Ht 70.0 in | Wt 236.1 lb

## 2022-07-26 DIAGNOSIS — D72828 Other elevated white blood cell count: Secondary | ICD-10-CM | POA: Diagnosis not present

## 2022-07-26 DIAGNOSIS — D72829 Elevated white blood cell count, unspecified: Secondary | ICD-10-CM

## 2022-07-26 NOTE — Patient Instructions (Signed)
[facility] at [facility] Discharge Instructions  You were seen today by Rojelio Brenner PA-C for your elevated white blood cells.  Your labs did not show any signs of blood cancer or genetic mutations that could cause elevated white blood cells.  I suspect that your elevated white blood cells are "reactive" to underlying inflammation (from obesity, diabetes, and gout) and your use of steroid medications.  We will recheck your blood count every 6 months for the next year to ensure that you are stable.  FOLLOW-UP APPOINTMENT: Same-day labs and office visit in 6 months   - - - - - - - - - - - - - - - - - -    Thank you for choosing [facility] at [facility] to provide your oncology and hematology care.  To afford each patient quality time with our provider, please arrive at least 15 minutes before your scheduled appointment time.   If you have a lab appointment with the Cancer Center please come in thru the Main Entrance and check in at the main information desk.  You need to re-schedule your appointment should you arrive 10 or more minutes late.  We strive to give you quality time with our providers, and arriving late affects you and other patients whose appointments are after yours.  Also, if you no show three or more times for appointments you may be dismissed from the clinic at the providers discretion.     Again, thank you for choosing [facility].  Our hope is that these requests will decrease the amount of time that you wait before being seen by our physicians.       _____________________________________________________________  Should you have questions after your visit to [facility], please contact our office at [phone] and follow the prompts.  Our office hours are 8:00 a.m. and 4:30 p.m. Monday - Friday.  Please note that voicemails left after 4:00 p.m. may not be returned until the following business day.   We are closed weekends and major holidays.  You do have access to a nurse 24-7, just call the main number to the clinic [phone] and do not press any options, hold on the line and a nurse will answer the phone.    For prescription refill requests, have your pharmacy contact our office and allow 72 hours.    Due to Covid, you will need to wear a mask upon entering the hospital. If you do not have a mask, a mask will be given to you at the Main Entrance upon arrival. For doctor visits, patients may have 1 support person age 43 or older with them. For treatment visits, patients can not have anyone with them due to social distancing guidelines and our immunocompromised population.

## 2022-07-27 ENCOUNTER — Ambulatory Visit (HOSPITAL_COMMUNITY): Payer: 59 | Admitting: Physician Assistant

## 2022-12-16 ENCOUNTER — Encounter: Payer: Self-pay | Admitting: *Deleted

## 2022-12-16 ENCOUNTER — Encounter (INDEPENDENT_AMBULATORY_CARE_PROVIDER_SITE_OTHER): Payer: Self-pay | Admitting: *Deleted

## 2023-01-27 NOTE — Progress Notes (Signed)
[place], [place] [address]   CLINIC:  Medical Oncology/Hematology  PCP:  Sharilyn Sites, [place] [address] [phone]   REASON FOR VISIT:  Follow-up for leukocytosis  PRIOR THERAPY: None  CURRENT THERAPY: Surveillance  INTERVAL HISTORY:   Stephen Alexander 57 y.o. male returns for routine follow-up of his leukocytosis.  He was last seen by Tarri Abernethy PA-C on 07/26/2022.  At today's visit, he reports feeling well.  No recent hospitalizations, surgeries, or changes in baseline health status.  No B symptoms such as fever, chills, night sweats, unintentional weight loss.  Denies any nausea, vomiting, or diarrhea. Denies any new pains.  He denies any changes in his bowel, bladder, or breathing habits. No new masses or lymphadenopathy per his report.  His arthritis has been "acting up" this winter.   He has 50% energy and 100% appetite. He continues to lose weight while on Mounjaro.  ASSESSMENT & PLAN:  1.  Neutrophilic leukocytosis - Seen at the request of his PCP (Dr. Sharilyn Sites) for evaluation of leukocytosis. - Per PCP note from 04/05/2022, patient had labs checked by his place of employment, with WBC 11.5.  CBC checked by PCP on 04/05/2022 shows WBC 10.9 with ANC 7.6.  Repeat CBC (05/18/2022) shows WBC 12.8 with ANC 9.8.  Hemoglobin and platelets were normal. - No autoimmune connective tissue disorders.  PMH significant for gout, diabetes, and obesity. - He uses clotrimazole-betamethasone cream as well as triamcinolone cream for intermittently for skin rash for many years.  Requires occasional oral steroids for his degenerative disc disease. - He is a former smoker, quit in 2001 after smoking <0.25 PPD for 12 years. - Hematology work-up (07/06/2022): JAK2 with reflex to CALR, MPL, and E12-15 was negative.  Negative BCR/ABL. Elevated ESR at 27, elevated CRP 1.2.  Negative ANA, RF, LDH. - Labs today (01/28/2023):  Normal WBC 9.2 and normal differential. - No B symptoms such as fever, chills, night sweats, unintentional weight loss. - No palpable lymphadenopathy on exam. - PLAN: WBC is currently normal, and prior testing did not show any evidence of malignancy.  We discussed option of annual hematology follow-up versus PCP follow-up.  Patient opts to return to PCP. - Myeloproliferative disorder has been ruled out.  Suspect reactive leukocytosis in the setting of inflammation and occasional steroid use.  No further hematology work-up is needed.  We will discharge patient from clinic.  He should continue to follow with his  PCP with CBC checked at least annually.  He  can be referred back to [place] in the future if WBC persistently >20,000 (in the absence of acute infection) or if he develops unexplained B symptoms in the presence of any amount of leukocytosis.  2.  Other history - PMH: Type 2 diabetes mellitus, chronic pain from degenerative disc disease (L4/L5 disc herniation), obesity, hypertension, and gout. - SOCIAL: Patient works in [employer], [employer].  He is a former smoker, quit in 2001, with 3-pack-year history.  He reports history of heavy alcohol consumption, quit in 1998.  He denies any history of illicit drug use. - FAMILY: Family history is negative for any blood disorders.  Patient's maternal grandmother and maternal uncle both had colon cancer.  PLAN SUMMARY: >> Discharge from clinic for ongoing PCP follow-up.  Can return to [place] on an as-needed basis.     REVIEW OF SYSTEMS:   Review of Systems  Constitutional:  Positive for fatigue. Negative for appetite change,  chills, diaphoresis, fever and unexpected weight change.  HENT:   Negative for lump/mass and nosebleeds.   Eyes:  Negative for eye problems.  Respiratory:  Negative for cough, hemoptysis and shortness of breath.   Cardiovascular:  Negative for chest pain, leg swelling and palpitations.  Gastrointestinal:  Positive for constipation.  Negative for abdominal pain, blood in stool, diarrhea, nausea and vomiting.  Genitourinary:  Negative for hematuria.   Musculoskeletal:  Positive for arthralgias.  Skin: Negative.   Neurological:  Positive for numbness. Negative for dizziness, headaches and light-headedness.  Hematological:  Does not bruise/bleed easily.  Psychiatric/Behavioral:  Positive for sleep disturbance.      PHYSICAL EXAM:  ECOG PERFORMANCE STATUS: 1 - Symptomatic but completely ambulatory  There were no vitals filed for this visit. There were no vitals filed for this visit. Physical Exam Constitutional:      Appearance: Normal appearance. He is obese.  HENT:     Head: Normocephalic and atraumatic.     Mouth/Throat:     Mouth: Mucous membranes are moist.  Eyes:     Extraocular Movements: Extraocular movements intact.     Pupils: Pupils are equal, round, and reactive to light.  Cardiovascular:     Rate and Rhythm: Normal rate and regular rhythm.     Pulses: Normal pulses.     Heart sounds: Normal heart sounds.  Pulmonary:     Effort: Pulmonary effort is normal.     Breath sounds: Normal breath sounds.  Abdominal:     General: Bowel sounds are normal.     Palpations: Abdomen is soft.     Tenderness: There is no abdominal tenderness.  Musculoskeletal:        General: No swelling.     Right lower leg: No edema.     Left lower leg: No edema.  Lymphadenopathy:     Cervical: No cervical adenopathy.  Skin:    General: Skin is warm and dry.  Neurological:     General: No focal deficit present.     Mental Status: He is alert and oriented to person, place, and time.  Psychiatric:        Mood and Affect: Mood normal.        Behavior: Behavior normal.     PAST MEDICAL/SURGICAL HISTORY:  Past Medical History:  Diagnosis Date   Atypical chest pain    history   Back pain    Chronic discogenic    DM (diabetes mellitus) (HCC)    GERD (gastroesophageal reflux disease)    Hiatal hernia 2013   small    Hyperlipidemia    Hypertension    Past Surgical History:  Procedure Laterality Date   COLONOSCOPY    11/03/2005   Dr. Elmer Ramp rectum, colon, terminal ileum.   COLONOSCOPY  09/08/12   Dr. [name]- friable anal canal o/w normal rectum and colon   ESOPHAGOGASTRODUODENOSCOPY  09/08/12   Dr. [name]- mild inflammatory changes of the distal esophagus consistent with erosive reflux esophagitits. small hiatal hernia   NM MYOCAR PERF WALL MOTION  01/30/2010   normal, EF  51%   US ECHOCARDIOGRAPHY  01/30/2010   normal    SOCIAL HISTORY:  Social History   Socioeconomic History   Marital status: Single    Spouse name: Not on file   Number of children: Not on file   Years of education: Not on file   Highest education level: Not on file  Occupational History   Not on file  Tobacco Use  Smoking status: Former   Smokeless tobacco: Never   Tobacco comments:    Socially  Substance and Sexual Activity   Alcohol use: No   Drug use: No   Sexual activity: Not on file  Other Topics Concern   Not on file  Social History Narrative   Not on file   Social Determinants of Health   Financial Resource Strain: Not on file  Food Insecurity: Not on file  Transportation Needs: Not on file  Physical Activity: Not on file  Stress: Not on file  Social Connections: Not on file  Intimate Partner Violence: Not on file    FAMILY HISTORY:  Family History  Problem Relation Age of Onset   Heart failure Father    Hypertension Father     CURRENT MEDICATIONS:  Outpatient Encounter Medications as of 01/28/2023  Medication Sig   Cinnamon 500 MG TABS Take 500 mg by mouth daily.   Coenzyme Q10 (CO Q-10) 300 MG CAPS Take 300 mg by mouth daily.   cyclobenzaprine (FLEXERIL) 10 MG tablet Take 10 mg by mouth 3 (three) times daily as needed.   esomeprazole (NEXIUM) 20 MG capsule Take 20 mg by mouth daily at 12 noon.   Fexofenadine HCl (ALLEGRA PO) Take by mouth daily.   HYDROcodone-acetaminophen (NORCO)  10-325 MG per tablet Take 1 tablet by mouth every 6 (six) hours as needed for pain.   linagliptin (TRADJENTA) 5 MG TABS tablet Take 5 mg by mouth daily.   losartan (COZAAR) 50 MG tablet Take 50 mg by mouth daily.   metFORMIN (GLUCOPHAGE) 500 MG tablet Take by mouth 2 (two) times daily with a meal.   nebivolol (BYSTOLIC) 5 MG tablet Take 5 mg by mouth daily.   OMEGA-3 KRILL OIL 300 MG CAPS Take by mouth daily.   Red Yeast Rice 600 MG TABS Take 600 mg by mouth 2 (two) times daily.   tirzepatide (MOUNJARO) 10 MG/0.5ML Pen Inject 12.5 mg into the skin once a week.   zolpidem (AMBIEN) 10 MG tablet Take 10 mg by mouth at bedtime as needed for sleep.   No facility-administered encounter medications on file as of 01/28/2023.    ALLERGIES:  No Known Allergies  LABORATORY DATA:  I have reviewed the labs as listed.  CBC    Component Value Date/Time   WBC 10.7 (H) 07/06/2022 1041   RBC 4.58 07/06/2022 1041   HGB 13.6 07/06/2022 1041   HCT 40.9 07/06/2022 1041   PLT 257 07/06/2022 1041   MCV 89.3 07/06/2022 1041   MCH 29.7 07/06/2022 1041   MCHC 33.3 07/06/2022 1041   RDW 13.7 07/06/2022 1041   LYMPHSABS 2.0 07/06/2022 1041   MONOABS 0.7 07/06/2022 1041   EOSABS 0.2 07/06/2022 1041   BASOSABS 0.1 07/06/2022 1041      Latest Ref Rng & Units 12/26/2009    2:34 AM 12/25/2009    8:27 PM  CMP  Glucose 70 - 99 mg/dL 121  137   BUN 6 - 23 mg/dL 8  9   Creatinine 0.4 - 1.5 mg/dL 0.71  0.71   Sodium 135 - 145 mEq/L 137  136   Potassium 3.5 - 5.1 mEq/L 4.0  3.8   Chloride 96 - 112 mEq/L 103  98   CO2 19 - 32 mEq/L 28  26   Calcium 8.4 - 10.5 mg/dL 9.6  9.7     DIAGNOSTIC IMAGING:  I have independently reviewed the relevant imaging and discussed with the patient.  WRAP UP:  All questions were answered. The patient knows to call the clinic with any problems, questions or concerns.  Medical decision making: Low  Time spent on visit: I spent 15 minutes counseling the patient face to  face. The total time spent in the appointment was 22 minutes and more than 50% was on counseling.  Harriett Rush, PA-C  01/28/23 11:13 AM

## 2023-01-28 ENCOUNTER — Inpatient Hospital Stay: Payer: 59 | Attending: Physician Assistant | Admitting: Physician Assistant

## 2023-01-28 ENCOUNTER — Inpatient Hospital Stay: Payer: 59

## 2023-01-28 VITALS — BP 122/81 | HR 70 | Temp 97.8°F | Resp 16 | Wt 226.0 lb

## 2023-01-28 DIAGNOSIS — Z7984 Long term (current) use of oral hypoglycemic drugs: Secondary | ICD-10-CM | POA: Diagnosis not present

## 2023-01-28 DIAGNOSIS — Z79899 Other long term (current) drug therapy: Secondary | ICD-10-CM | POA: Insufficient documentation

## 2023-01-28 DIAGNOSIS — Z87891 Personal history of nicotine dependence: Secondary | ICD-10-CM | POA: Insufficient documentation

## 2023-01-28 DIAGNOSIS — E119 Type 2 diabetes mellitus without complications: Secondary | ICD-10-CM | POA: Diagnosis not present

## 2023-01-28 DIAGNOSIS — Z800 Family history of malignant neoplasm of digestive organs: Secondary | ICD-10-CM | POA: Diagnosis not present

## 2023-01-28 DIAGNOSIS — D72829 Elevated white blood cell count, unspecified: Secondary | ICD-10-CM

## 2023-01-28 DIAGNOSIS — I10 Essential (primary) hypertension: Secondary | ICD-10-CM | POA: Insufficient documentation

## 2023-01-28 DIAGNOSIS — E669 Obesity, unspecified: Secondary | ICD-10-CM | POA: Diagnosis not present

## 2023-01-28 DIAGNOSIS — D72828 Other elevated white blood cell count: Secondary | ICD-10-CM | POA: Insufficient documentation

## 2023-01-28 LAB — CBC WITH DIFFERENTIAL/PLATELET
Abs Immature Granulocytes: 0.01 10*3/uL (ref 0.00–0.07)
Basophils Absolute: 0.1 10*3/uL (ref 0.0–0.1)
Basophils Relative: 1 %
Eosinophils Absolute: 0.2 10*3/uL (ref 0.0–0.5)
Eosinophils Relative: 3 %
HCT: 43.1 % (ref 39.0–52.0)
Hemoglobin: 14.7 g/dL (ref 13.0–17.0)
Immature Granulocytes: 0 %
Lymphocytes Relative: 24 %
Lymphs Abs: 2.2 10*3/uL (ref 0.7–4.0)
MCH: 30.8 pg (ref 26.0–34.0)
MCHC: 34.1 g/dL (ref 30.0–36.0)
MCV: 90.4 fL (ref 80.0–100.0)
Monocytes Absolute: 0.6 10*3/uL (ref 0.1–1.0)
Monocytes Relative: 6 %
Neutro Abs: 6.1 10*3/uL (ref 1.7–7.7)
Neutrophils Relative %: 66 %
Platelets: 280 10*3/uL (ref 150–400)
RBC: 4.77 MIL/uL (ref 4.22–5.81)
RDW: 12.9 % (ref 11.5–15.5)
WBC: 9.2 10*3/uL (ref 4.0–10.5)
nRBC: 0 % (ref 0.0–0.2)

## 2023-01-28 NOTE — Patient Instructions (Signed)
[place] [facility] [place] **   You were seen today by Tarri Abernethy PA-C for your elevated white blood cells, which were normal on today's labs. Based on the labs that we checked, your white blood cell count does not show any signs of blood cancer.  We suspect that your white blood cells are elevated due to inflammation and occasional steroid use.  You do not need any further testing or appointments at the hematology clinic [facility]).  However, you should continue to follow-up with your primary care doctor and should have your blood counts checked at least once per year.  Your primary care doctor can refer you back to see [place] in the future if needed if you have persistently elevated white blood cells > 20 (other than during active infections), or if you have any elevation in blood cells with unexplained fever, chills, night sweats, or weight loss.    FOLLOW-UP APPOINTMENT: We will not schedule any additional appointments at this time, but if you or your primary care doctor have any other concerns you are welcome to be referred back to [place] as needed!  ** Thank you for trusting me with your healthcare!  I strive to provide all of my patients with quality care at each visit.  If you receive a survey for this visit, I would be so grateful to you for taking the time to provide feedback.  Thank you in advance!  ~ Ravneet Spilker                   Dr. Derek Jack   &   Tarri Abernethy, PA-C   - - - - - - - - - - - - - - - - - -    Thank you for choosing [place] at [facility] to provide your oncology and hematology care.  To afford each patient quality time with our provider, please arrive at least 15 minutes before your scheduled appointment time.   If you have a lab appointment with the [facility] please come in thru the Main Entrance and check in at the main information desk.  You need  to re-schedule your appointment should you arrive 10 or more minutes late.  We strive to give you quality time with our providers, and arriving late affects you and other patients whose appointments are after yours.  Also, if you no show three or more times for appointments you may be dismissed from the clinic at the providers discretion.     Again, thank you for choosing [facility].  Our hope is that these requests will decrease the amount of time that you wait before being seen by our physicians.       _____________________________________________________________  Should you have questions after your visit to [facility], please contact our office at [phone] and follow the prompts.  Our office hours are 8:00 a.m. and 4:30 p.m. Monday - Friday.  Please note that voicemails left after 4:00 p.m. may not be returned until the following business day.  We are closed weekends and major holidays.  You do have access to a nurse 24-7, just call the main number to the clinic [phone] and do not press any options, hold on the line and a nurse will answer the phone.    For prescription refill requests, have your pharmacy contact our office and allow 72 hours.

## 2023-01-31 ENCOUNTER — Telehealth (INDEPENDENT_AMBULATORY_CARE_PROVIDER_SITE_OTHER): Payer: Self-pay | Admitting: *Deleted

## 2023-01-31 NOTE — Telephone Encounter (Signed)
Received TCS questionnaire from patient. Marked having rectal bleeding, change in bowel habits. Will need OV

## 2023-02-17 ENCOUNTER — Ambulatory Visit: Payer: 59 | Admitting: Gastroenterology

## 2023-02-28 NOTE — Progress Notes (Unsigned)
Referring Provider:*** Primary Care Physician:  Sharilyn Sites, MD Primary Gastroenterologist:  Dr. Rayne Du chief complaint on file.   HPI:   Stephen Alexander is a 57 y.o. male presenting today to discuss scheduling colonoscopy. We we received colonoscopy questionnaire from patient who had marked rectal bleeding and change in bowel habits. Recommended OV prior to scheduling procedure. .   Last colonoscopy 2013: Friable anal canal- likely source of hematochezia.   Today:    Past Medical History:  Diagnosis Date   Atypical chest pain    history   Back pain    Chronic discogenic    DM (diabetes mellitus) ([facility])    GERD (gastroesophageal reflux disease)    Hiatal hernia 2013   small   Hyperlipidemia    Hypertension     Past Surgical History:  Procedure Laterality Date   COLONOSCOPY    11/03/2005   Dr. Vivi Ferns rectum, colon, terminal ileum.   COLONOSCOPY  09/08/12   Dr. Gala Romney- friable anal canal o/w normal rectum and colon   ESOPHAGOGASTRODUODENOSCOPY  09/08/12   Dr. Gala Romney- mild inflammatory changes of the distal esophagus consistent with erosive reflux esophagitits. small hiatal hernia   NM MYOCAR PERF WALL MOTION  01/30/2010   normal, EF  51%   US ECHOCARDIOGRAPHY  01/30/2010   normal    Current Outpatient Medications  Medication Sig Dispense Refill   Cinnamon 500 MG TABS Take 500 mg by mouth daily.     Coenzyme Q10 (CO Q-10) 300 MG CAPS Take 300 mg by mouth daily.     cyclobenzaprine (FLEXERIL) 10 MG tablet Take 10 mg by mouth 3 (three) times daily as needed.     docusate sodium (COLACE) 50 MG capsule Take 50 mg by mouth 2 (two) times daily.     esomeprazole (NEXIUM) 20 MG capsule Take 20 mg by mouth daily at 12 noon.     Fexofenadine HCl (ALLEGRA PO) Take by mouth daily.     HYDROcodone-acetaminophen (NORCO) 10-325 MG per tablet Take 1 tablet by mouth every 6 (six) hours as needed for pain.     linagliptin (TRADJENTA) 5 MG TABS tablet Take 5 mg by mouth daily.      losartan (COZAAR) 50 MG tablet Take 50 mg by mouth daily.     metFORMIN (GLUCOPHAGE) 500 MG tablet Take by mouth 2 (two) times daily with a meal.     nebivolol (BYSTOLIC) 5 MG tablet Take 5 mg by mouth daily.     OMEGA-3 KRILL OIL 300 MG CAPS Take by mouth daily.     polyethylene glycol (MIRALAX / GLYCOLAX) 17 g packet Take 17 g by mouth daily.     Red Yeast Rice 600 MG TABS Take 600 mg by mouth 2 (two) times daily.     tirzepatide (MOUNJARO) 10 MG/0.5ML Pen Inject 12.5 mg into the skin once a week.     zolpidem (AMBIEN) 10 MG tablet Take 10 mg by mouth at bedtime as needed for sleep.     No current facility-administered medications for this visit.    Allergies as of 03/02/2023   (No Known Allergies)    Family History  Problem Relation Age of Onset   Heart failure Father    Hypertension Father     Social History   Socioeconomic History   Marital status: Single    Spouse name: Not on file   Number of children: Not on file   Years of education: Not on file   Highest  education level: Not on file  Occupational History   Not on file  Tobacco Use   Smoking status: Former   Smokeless tobacco: Never   Tobacco comments:    Socially  Substance and Sexual Activity   Alcohol use: No   Drug use: No   Sexual activity: Not on file  Other Topics Concern   Not on file  Social History Narrative   Not on file   Social Determinants of Health   Financial Resource Strain: Not on file  Food Insecurity: Not on file  Transportation Needs: Not on file  Physical Activity: Not on file  Stress: Not on file  Social Connections: Not on file  Intimate Partner Violence: Not on file    Review of Systems: Gen: Denies any fever, chills, fatigue, weight loss, lack of appetite.  CV: Denies chest pain, heart palpitations, peripheral edema, syncope.  Resp: Denies shortness of breath at rest or with exertion. Denies wheezing or cough.  GI: Denies dysphagia or odynophagia. Denies jaundice,  hematemesis, fecal incontinence. GU : Denies urinary burning, urinary frequency, urinary hesitancy MS: Denies joint pain, muscle weakness, cramps, or limitation of movement.  Derm: Denies rash, itching, dry skin Psych: Denies depression, anxiety, memory loss, and confusion Heme: Denies bruising, bleeding, and enlarged lymph nodes.  Physical Exam: There were no vitals taken for this visit. General:   Alert and oriented. Pleasant and cooperative. Well-nourished and well-developed.  Head:  Normocephalic and atraumatic. Eyes:  Without icterus, sclera clear and conjunctiva pink.  Ears:  Normal auditory acuity. Lungs:  Clear to auscultation bilaterally. No wheezes, rales, or rhonchi. No distress.  Heart:  S1, S2 present without murmurs appreciated.  Abdomen:  +BS, soft, non-tender and non-distended. No HSM noted. No guarding or rebound. No masses appreciated.  Rectal:  Deferred  Msk:  Symmetrical without gross deformities. Normal posture. Extremities:  Without edema. Neurologic:  Alert and  oriented x4;  grossly normal neurologically. Skin:  Intact without significant lesions or rashes. Psych:  Alert and cooperative. Normal mood and affect.    Assessment:     Plan:  ***   Aliene Altes, PA-C [facility] 03/02/2023

## 2023-03-02 ENCOUNTER — Encounter: Payer: Self-pay | Admitting: Gastroenterology

## 2023-03-02 ENCOUNTER — Ambulatory Visit: Payer: 59 | Admitting: Gastroenterology

## 2023-03-02 ENCOUNTER — Other Ambulatory Visit: Payer: Self-pay | Admitting: *Deleted

## 2023-03-02 ENCOUNTER — Encounter: Payer: Self-pay | Admitting: *Deleted

## 2023-03-02 ENCOUNTER — Telehealth: Payer: Self-pay | Admitting: *Deleted

## 2023-03-02 VITALS — BP 120/81 | HR 79 | Temp 98.0°F | Ht 70.0 in | Wt 231.8 lb

## 2023-03-02 DIAGNOSIS — K219 Gastro-esophageal reflux disease without esophagitis: Secondary | ICD-10-CM | POA: Diagnosis not present

## 2023-03-02 DIAGNOSIS — K5903 Drug induced constipation: Secondary | ICD-10-CM

## 2023-03-02 DIAGNOSIS — K625 Hemorrhage of anus and rectum: Secondary | ICD-10-CM | POA: Diagnosis not present

## 2023-03-02 DIAGNOSIS — K5900 Constipation, unspecified: Secondary | ICD-10-CM | POA: Insufficient documentation

## 2023-03-02 MED ORDER — PEG 3350-KCL-NA BICARB-NACL 420 G PO SOLR: 4000.0000 mL | Freq: Once | ORAL | 0 refills | Status: AC

## 2023-03-02 NOTE — Telephone Encounter (Signed)
[employer] PA: Colonoscopy: Case Number: [id number] Review Date: 03/02/2023 1:49:56 PM Expiration Date: N/A Status: This member is not in scope for prior-authorization/notification for the services requested. You can save the case reference ID as validation of your request.  [id number] Number: [id number] Review Date: 03/02/2023 1:51:31 PM Expiration Date: N/A Status: This member is not in scope for prior-authorization/notification for the services requested. You can save the case reference ID as validation of your request.

## 2023-03-02 NOTE — Patient Instructions (Addendum)
We will arrange for you to have an upper endoscopy and colonoscopy in the near future with Dr. Gala Romney at [place] will need to hold [facility] for 1 week prior to your procedures. 1 day prior to procedures: Take metformin in the morning, but hold your evening dose. Day of procedures: No morning diabetes medications. We will have you take Linzess 145 mcg for 4 days prior to starting your colon prep to ensure that your bowels are moving well. We will also have you complete an extra day of clear liquids due to your history of constipation.  For management of constipation: Start Linzess 145 mcg daily 30 minutes before breakfast.  We are providing you with samples today.  Please call with a progress report in 1 week.  If this medication works well, I will send in a prescription for you.  If not, we will discuss other options.  For rectal bleeding likely secondary to hemorrhoids: Limit toilet time to 2-3 minutes Avoid straining. Use over-the-counter Preparation H twice daily as needed.  We will follow-up with you in the office after your procedures.  Do not hesitate to call sooner if you have questions or concerns.  It was a pleasure to meet you today! I want to create trusting relationships with patients. If you receive a survey regarding your visit,  I greatly appreciate you taking time to fill this out on paper or through your MyChart. I value your feedback.  Aliene Altes, PA-C [facility]

## 2023-03-31 ENCOUNTER — Encounter (HOSPITAL_COMMUNITY)
Admission: RE | Admit: 2023-03-31 | Discharge: 2023-03-31 | Disposition: A | Discharge status: Home / Self Care | Payer: 59 | Source: Ambulatory Visit | Attending: Internal Medicine | Admitting: Internal Medicine

## 2023-03-31 NOTE — Pre-Procedure Instructions (Signed)
Attempted pre-op phone call. Left VM on home and cell phone for him to call us back.

## 2023-04-01 ENCOUNTER — Other Ambulatory Visit: Payer: Self-pay

## 2023-04-01 ENCOUNTER — Encounter (HOSPITAL_COMMUNITY): Payer: Self-pay

## 2023-04-01 ENCOUNTER — Telehealth: Payer: Self-pay | Admitting: *Deleted

## 2023-04-01 NOTE — Telephone Encounter (Signed)
Elsie Amis, RN  Elinor Dodge, LPN Hey Ruperto Kiernan! We have called and left 3 messages for Joe Fazzina to call us back about his arrival time and to make sure he has stopped his mounjaro for his procedure on 4/8. He is the first case for Dr [name] on Monday. I just wanted to let you know.  LMTRC at both numbers

## 2023-04-01 NOTE — Pre-Procedure Instructions (Signed)
Attempted pre-op phone call again. Left VM for him to call bac. Messaged [facility] as well to let them know we have been unable to contact patient.

## 2023-04-04 ENCOUNTER — Encounter (HOSPITAL_COMMUNITY)
Admission: RE | Disposition: A | Discharge status: Home / Self Care | Payer: Self-pay | Source: Home / Self Care | Attending: Internal Medicine

## 2023-04-04 ENCOUNTER — Ambulatory Visit (HOSPITAL_BASED_OUTPATIENT_CLINIC_OR_DEPARTMENT_OTHER): Payer: 59 | Admitting: Certified Registered Nurse Anesthetist

## 2023-04-04 ENCOUNTER — Ambulatory Visit (HOSPITAL_COMMUNITY): Payer: 59 | Admitting: Certified Registered Nurse Anesthetist

## 2023-04-04 ENCOUNTER — Encounter (HOSPITAL_COMMUNITY): Payer: Self-pay | Admitting: Internal Medicine

## 2023-04-04 ENCOUNTER — Ambulatory Visit (HOSPITAL_COMMUNITY)
Admission: RE | Admit: 2023-04-04 | Discharge: 2023-04-04 | Disposition: A | Discharge status: Home / Self Care | Payer: 59 | Attending: Internal Medicine | Admitting: Internal Medicine

## 2023-04-04 DIAGNOSIS — K449 Diaphragmatic hernia without obstruction or gangrene: Secondary | ICD-10-CM

## 2023-04-04 DIAGNOSIS — K640 First degree hemorrhoids: Secondary | ICD-10-CM | POA: Diagnosis not present

## 2023-04-04 DIAGNOSIS — K635 Polyp of colon: Secondary | ICD-10-CM | POA: Insufficient documentation

## 2023-04-04 DIAGNOSIS — Z87891 Personal history of nicotine dependence: Secondary | ICD-10-CM | POA: Insufficient documentation

## 2023-04-04 DIAGNOSIS — K219 Gastro-esophageal reflux disease without esophagitis: Secondary | ICD-10-CM | POA: Insufficient documentation

## 2023-04-04 DIAGNOSIS — Z1211 Encounter for screening for malignant neoplasm of colon: Secondary | ICD-10-CM

## 2023-04-04 DIAGNOSIS — I10 Essential (primary) hypertension: Secondary | ICD-10-CM | POA: Insufficient documentation

## 2023-04-04 DIAGNOSIS — K625 Hemorrhage of anus and rectum: Secondary | ICD-10-CM | POA: Insufficient documentation

## 2023-04-04 DIAGNOSIS — K649 Unspecified hemorrhoids: Secondary | ICD-10-CM

## 2023-04-04 DIAGNOSIS — D124 Benign neoplasm of descending colon: Secondary | ICD-10-CM

## 2023-04-04 DIAGNOSIS — K269 Duodenal ulcer, unspecified as acute or chronic, without hemorrhage or perforation: Secondary | ICD-10-CM | POA: Insufficient documentation

## 2023-04-04 DIAGNOSIS — E119 Type 2 diabetes mellitus without complications: Secondary | ICD-10-CM | POA: Diagnosis not present

## 2023-04-04 DIAGNOSIS — K2980 Duodenitis without bleeding: Secondary | ICD-10-CM | POA: Insufficient documentation

## 2023-04-04 DIAGNOSIS — K2950 Unspecified chronic gastritis without bleeding: Secondary | ICD-10-CM | POA: Diagnosis not present

## 2023-04-04 DIAGNOSIS — R12 Heartburn: Secondary | ICD-10-CM

## 2023-04-04 DIAGNOSIS — Z538 Procedure and treatment not carried out for other reasons: Secondary | ICD-10-CM | POA: Diagnosis not present

## 2023-04-04 DIAGNOSIS — Z7984 Long term (current) use of oral hypoglycemic drugs: Secondary | ICD-10-CM | POA: Insufficient documentation

## 2023-04-04 DIAGNOSIS — Z09 Encounter for follow-up examination after completed treatment for conditions other than malignant neoplasm: Secondary | ICD-10-CM | POA: Insufficient documentation

## 2023-04-04 HISTORY — PX: COLONOSCOPY WITH PROPOFOL: SHX5780

## 2023-04-04 HISTORY — PX: BIOPSY: SHX5522

## 2023-04-04 HISTORY — PX: POLYPECTOMY: SHX5525

## 2023-04-04 HISTORY — PX: ESOPHAGOGASTRODUODENOSCOPY (EGD) WITH PROPOFOL: SHX5813

## 2023-04-04 LAB — GLUCOSE, CAPILLARY: Glucose-Capillary: 104 mg/dL — ABNORMAL HIGH (ref 70–99)

## 2023-04-04 SURGERY — COLONOSCOPY WITH PROPOFOL
Anesthesia: General

## 2023-04-04 MED ORDER — PROPOFOL 500 MG/50ML IV EMUL
INTRAVENOUS | Status: AC
  Filled 2023-04-04: qty 50

## 2023-04-04 MED ORDER — LACTATED RINGERS IV SOLN: INTRAVENOUS | Status: DC

## 2023-04-04 MED ORDER — PROPOFOL 10 MG/ML IV BOLUS
INTRAVENOUS | Status: DC | PRN
  Administered 2023-04-04: 50 mg via INTRAVENOUS
  Administered 2023-04-04: 80 mg via INTRAVENOUS

## 2023-04-04 MED ORDER — PROPOFOL 500 MG/50ML IV EMUL
INTRAVENOUS | Status: DC | PRN
  Administered 2023-04-04: 180 ug/kg/min via INTRAVENOUS

## 2023-04-04 NOTE — Op Note (Signed)
[facility] Patient Name: Stephen Alexander Procedure Date: 04/04/2023 7:20 AM MRN: [id number] Date of Birth: October 18, 1966 Attending MD: Gennette Pac , MD, [id number] CSN: [id number] Age: 57 Admit Type: Outpatient Procedure:                Upper GI endoscopy Indications:              Heartburn Providers:                Gennette Pac, MD, Sheran Fava,                            Pandora Leiter, Technician Referring MD:              Medicines:                Propofol per Anesthesia Complications:            No immediate complications. Estimated Blood Loss:     Estimated blood loss was minimal. Procedure:                Pre-Anesthesia Assessment:                           - Prior to the procedure, a History and Physical                            was performed, and patient medications and                            allergies were reviewed. The patient's tolerance of                            previous anesthesia was also reviewed. The risks                            and benefits of the procedure and the sedation                            options and risks were discussed with the patient.                            All questions were answered, and informed consent                            was obtained. Prior Anticoagulants: The patient has                            taken no anticoagulant or antiplatelet agents. ASA                            Grade Assessment: III - A patient with severe                            systemic disease. After reviewing the risks and  benefits, the patient was deemed in satisfactory                            condition to undergo the procedure.                           After obtaining informed consent, the endoscope was                            passed under direct vision. Throughout the                            procedure, the patient's blood pressure, pulse, and                            oxygen saturations  were monitored continuously. The                            [id number]-[id number] ([id number]) scope was introduced through the                            mouth, and advanced to the second part of duodenum.                            The upper GI endoscopy was accomplished without                            difficulty. The patient tolerated the procedure                            well. Scope In: 7:39:16 AM Scope Out: 7:47:03 AM Total Procedure Duration: 0 hours 7 minutes 47 seconds  Findings:      The examined esophagus was normal. Gastric cavity empty. Antral       erosions. No ulcer or infiltrating process. Pylorus patent.      Examination of duodenal bulb and second portion revealed scattered       erosions. There was significant erosions on the fold tips through the       second portion of the duodenum these findings tapered off going into D3       which appeared normal.      Biopsies of the abnormal duodenum and gastric mucosa were taken for       histologic study. Impression:               - Normal esophagus. Gastric and duodenal erosions.                            Status post gastric and duodenal biopsy                           - No specimens collected. Moderate Sedation:      Moderate (conscious) sedation was personally administered by an       anesthesia professional. The following parameters were monitored: oxygen       saturation, heart rate, blood pressure, respiratory rate, EKG, adequacy  of pulmonary ventilation, and response to care. Recommendation:           - Patient has a contact number available for                            emergencies. The signs and symptoms of potential                            delayed complications were discussed with the                            patient. Return to normal activities tomorrow.                            Written discharge instructions were provided to the                            patient.                           - Advance diet as  tolerated.                           - Continue present medications.                           - Await pathology results.                           - Return to my office in 3 weeks. Procedure Code(s):        --- Professional ---                           [id number], Esophagogastroduodenoscopy, flexible,                            transoral; diagnostic, including collection of                            specimen(s) by brushing or washing, when performed                            (separate procedure) Diagnosis Code(s):        --- Professional ---                           R12, Heartburn CPT copyright 2022 American [employer] Association. All rights reserved. The codes documented in this report are preliminary and upon coder review may  be revised to meet current compliance requirements. Gerrit Friends. Jehiel Koepp, MD Gennette Pac, MD 04/04/2023 8:10:54 AM This report has been signed electronically. Number of Addenda: 0

## 2023-04-04 NOTE — Anesthesia Procedure Notes (Signed)
Procedure Name: General with mask airway Date/Time: 04/04/2023 7:35 AM  Performed by: Cy Blamer, CRNAPre-anesthesia Checklist: Patient identified, Emergency Drugs available, Suction available, Patient being monitored and Timeout performed Patient Re-evaluated:Patient Re-evaluated prior to induction Oxygen Delivery Method: Nasal cannula Preoxygenation: Pre-oxygenation with 100% oxygen Induction Type: IV induction Placement Confirmation: positive ETCO2 Dental Injury: Teeth and Oropharynx as per pre-operative assessment

## 2023-04-04 NOTE — H&P (Signed)
@LOGO @   Primary Care Physician:  [name], MD Primary Gastroenterologist:  Dr. [name]  Pre-Procedure History & Physical: HPI:  Stephen Alexander is a 57 y.o. male here for further evaluation of rectal bleeding and longstanding GERD.  No dysphagia.  Past Medical History:  Diagnosis Date   Atypical chest pain    history   Back pain    Chronic discogenic    DM (diabetes mellitus)    GERD (gastroesophageal reflux disease)    Hiatal hernia 12/28/2011   small   Hyperlipidemia    Hypertension     Past Surgical History:  Procedure Laterality Date   COLONOSCOPY    11/03/2005   Dr. Elmer Ramp rectum, colon, terminal ileum.   COLONOSCOPY  09/08/12   Dr. [name]- friable anal canal o/w normal rectum and colon   ESOPHAGOGASTRODUODENOSCOPY  09/08/12   Dr. [name]- mild inflammatory changes of the distal esophagus consistent with erosive reflux esophagitits. small hiatal hernia   NM MYOCAR PERF WALL MOTION  01/30/2010   normal, EF  51%   US ECHOCARDIOGRAPHY  01/30/2010   normal    Prior to Admission medications   Medication Sig Start Date End Date Taking? Authorizing Provider  Cinnamon 500 MG TABS Take 500 mg by mouth daily.   Yes [provider]  Coenzyme Q10 (CO Q-10) 300 MG CAPS Take 300 mg by mouth daily.   Yes [provider]  cyclobenzaprine (FLEXERIL) 10 MG tablet Take 10 mg by mouth 3 (three) times daily as needed.   Yes [provider]  docusate sodium (COLACE) 50 MG capsule Take 50 mg by mouth 2 (two) times daily.   Yes [provider]  esomeprazole (NEXIUM) 20 MG capsule Take 20 mg by mouth daily at 12 noon.   Yes [provider]  Fexofenadine HCl (ALLEGRA PO) Take by mouth daily.   Yes [provider]  HYDROcodone-acetaminophen (NORCO) 10-325 MG per tablet Take 1 tablet by mouth every 6 (six) hours as needed for pain.   Yes [provider]  losartan (COZAAR) 50 MG tablet Take 50 mg by mouth daily.   Yes [provider]  metFORMIN (GLUCOPHAGE) 500 MG tablet Take by mouth 2 (two) times daily with a meal.   Yes [provider]  nebivolol (BYSTOLIC) 5 MG tablet Take 5 mg by mouth daily.   Yes [provider]  OMEGA-3 KRILL OIL 300 MG CAPS Take by mouth daily.   Yes [provider]  polyethylene glycol (MIRALAX / GLYCOLAX) 17 g packet Take 17 g by mouth daily.   Yes [provider]  Red Yeast Rice 600 MG TABS Take 600 mg by mouth 2 (two) times daily.   Yes [provider]  zolpidem (AMBIEN) 10 MG tablet Take 10 mg by mouth at bedtime as needed for sleep.   Yes [provider]  tirzepatide Greggory Keen) 10 MG/0.5ML Pen Inject 12.5 mg into the skin once a week.    [provider]    Allergies as of 03/02/2023   (No Known Allergies)    Family History  Problem Relation Age of Onset   Heart failure Father    Hypertension Father     Social History   Socioeconomic History   Marital status: Single    Spouse name: Not on file   Number of children: Not on file   Years of education: Not on file   Highest education level: Not on file  Occupational History   Not on  file  Tobacco Use   Smoking status: Former   Smokeless tobacco: Never   Tobacco comments:    Socially  Substance and Sexual Activity   Alcohol use: No   Drug use: No   Sexual activity: Yes  Other Topics Concern   Not on file  Social History Narrative   Not on file   Social Determinants of Health   Financial Resource Strain: Not on file  Food Insecurity: Not on file  Transportation Needs: Not on file  Physical Activity: Not on file  Stress: Not on file  Social Connections: Not on file  Intimate Partner Violence: Not on file    Review of Systems: See HPI, otherwise negative ROS  Physical Exam: BP 117/71   Pulse 78   Temp 98 F (36.7 C) (Oral)   Resp 16   SpO2 95%  General:   Alert,  Well-developed, well-nourished, pleasant and cooperative in  NAD ficant cervical adenopathy. Lungs:  Clear throughout to auscultation.   No wheezes, crackles, or rhonchi. No acute distress. Heart:  Regular rate and rhythm; no murmurs, clicks, rubs,  or gallops. Abdomen: Non-distended, normal bowel sounds.  Soft and nontender without appreciable mass or hepatosplenomegaly.  Pulses:  Normal pulses noted. Extremities:  Without clubbing or edema.  Impression/Plan: 57 year old gentleman longstanding GERD no dysphagia.  No prior EGD.  Rectal bleeding.  I will offer the patient a EGD and a colonoscopy today per plan. The risks, benefits, limitations, imponderables and alternatives regarding both EGD and colonoscopy have been reviewed with the patient. Questions have been answered. All parties agreeable.       Notice: This dictation was prepared with Dragon dictation along with smaller phrase technology. Any transcriptional errors that result from this process are unintentional and may not be corrected upon review.

## 2023-04-04 NOTE — Transfer of Care (Signed)
Immediate Anesthesia Transfer of Care Note  Patient: Stephen Alexander  Procedure(s) Performed: COLONOSCOPY WITH PROPOFOL ESOPHAGOGASTRODUODENOSCOPY (EGD) WITH PROPOFOL BIOPSY POLYPECTOMY  Patient Location: Short Stay  Anesthesia Type:General  Level of Consciousness: awake, alert , and oriented  Airway & Oxygen Therapy: Patient Spontanous Breathing  Post-op Assessment: Report given to RN, Post -op Vital signs reviewed and stable, Patient moving all extremities X 4, and Patient able to stick tongue midline  Post vital signs: Reviewed  Last Vitals:  Vitals Value Taken Time  BP 108/61   Temp 97.7   Pulse 89   Resp 18   SpO2 96     Last Pain:  Vitals:   04/04/23 0735  TempSrc:   PainSc: 0-No pain      Patients Stated Pain Goal: 6 (04/04/23 1610)  Complications: No notable events documented.

## 2023-04-04 NOTE — Discharge Instructions (Signed)
Colonoscopy Discharge Instructions  Read the instructions outlined below and refer to this sheet in the next few weeks. These discharge instructions provide you with general information on caring for yourself after you leave the hospital. Your doctor may also give you specific instructions. While your treatment has been planned according to the most current medical practices available, unavoidable complications occasionally occur. If you have any problems or questions after discharge, call Dr. Gala Romney at [phone]. ACTIVITY You may resume your regular activity, but move at a slower pace for the next 24 hours.  Take frequent rest periods for the next 24 hours.  Walking will help get rid of the air and reduce the bloated feeling in your belly (abdomen).  No driving for 24 hours (because of the medicine (anesthesia) used during the test).   Do not sign any important legal documents or operate any machinery for 24 hours (because of the anesthesia used during the test).  NUTRITION Drink plenty of fluids.  You may resume your normal diet as instructed by your doctor.  Begin with a light meal and progress to your normal diet. Heavy or fried foods are harder to digest and may make you feel sick to your stomach (nauseated).  Avoid alcoholic beverages for 24 hours or as instructed.  MEDICATIONS You may resume your normal medications unless your doctor tells you otherwise.  WHAT YOU CAN EXPECT TODAY Some feelings of bloating in the abdomen.  Passage of more gas than usual.  Spotting of blood in your stool or on the toilet paper.  IF YOU HAD POLYPS REMOVED DURING THE COLONOSCOPY: No aspirin products for 7 days or as instructed.  No alcohol for 7 days or as instructed.  Eat a soft diet for the next 24 hours.  FINDING OUT THE RESULTS OF YOUR TEST Not all test results are available during your visit. If your test results are not back during the visit, make an appointment with your caregiver to find out the  results. Do not assume everything is normal if you have not heard from your caregiver or the medical facility. It is important for you to follow up on all of your test results.  SEEK IMMEDIATE MEDICAL ATTENTION IF: You have more than a spotting of blood in your stool.  Your belly is swollen (abdominal distention).  You are nauseated or vomiting.  You have a temperature over 101.  You have abdominal pain or discomfort that is severe or gets worse throughout the day.    Colonoscopy Discharge Instructions  Read the instructions outlined below and refer to this sheet in the next few weeks. These discharge instructions provide you with general information on caring for yourself after you leave the hospital. Your doctor may also give you specific instructions. While your treatment has been planned according to the most current medical practices available, unavoidable complications occasionally occur. If you have any problems or questions after discharge, call Dr. Gala Romney at [phone]. ACTIVITY You may resume your regular activity, but move at a slower pace for the next 24 hours.  Take frequent rest periods for the next 24 hours.  Walking will help get rid of the air and reduce the bloated feeling in your belly (abdomen).  No driving for 24 hours (because of the medicine (anesthesia) used during the test).   Do not sign any important legal documents or operate any machinery for 24 hours (because of the anesthesia used during the test).  NUTRITION Drink plenty of fluids.  You may resume  your normal diet as instructed by your doctor.  Begin with a light meal and progress to your normal diet. Heavy or fried foods are harder to digest and may make you feel sick to your stomach (nauseated).  Avoid alcoholic beverages for 24 hours or as instructed.  MEDICATIONS You may resume your normal medications unless your doctor tells you otherwise.  WHAT YOU CAN EXPECT TODAY Some feelings of bloating in the abdomen.   Passage of more gas than usual.  Spotting of blood in your stool or on the toilet paper.  IF YOU HAD POLYPS REMOVED DURING THE COLONOSCOPY: No aspirin products for 7 days or as instructed.  No alcohol for 7 days or as instructed.  Eat a soft diet for the next 24 hours.  FINDING OUT THE RESULTS OF YOUR TEST Not all test results are available during your visit. If your test results are not back during the visit, make an appointment with your caregiver to find out the results. Do not assume everything is normal if you have not heard from your caregiver or the medical facility. It is important for you to follow up on all of your test results.  SEEK IMMEDIATE MEDICAL ATTENTION IF: You have more than a spotting of blood in your stool.  Your belly is swollen (abdominal distention).  You are nauseated or vomiting.  You have a temperature over 101.  You have abdominal pain or discomfort that is severe or gets worse throughout the day.     You had 1 small polyp and hemorrhoids on colonoscopy  Your esophagus appeared normal normal upper endoscopy.  Your stomach and small intestine was somewhat inflamed.  Biopsies were taken.   further recommendations to follow pending review of pathology report  Office visit with [place] in 3 months  At patient request, I called Stephen Alexander at [phone] -  reviewed findings.

## 2023-04-04 NOTE — Anesthesia Preprocedure Evaluation (Signed)
Anesthesia Evaluation  Patient identified by MRN, date of birth, ID band Patient awake    Reviewed: Allergy & Precautions, H&P , NPO status , Patient's Chart, lab work & pertinent test results, reviewed documented beta blocker date and time   Airway Mallampati: II  TM Distance: >3 FB Neck ROM: full    Dental no notable dental hx.    Pulmonary neg pulmonary ROS, sleep apnea , former smoker   Pulmonary exam normal breath sounds clear to auscultation       Cardiovascular Exercise Tolerance: Good hypertension, negative cardio ROS  Rhythm:regular Rate:Normal     Neuro/Psych  Neuromuscular disease negative neurological ROS  negative psych ROS   GI/Hepatic negative GI ROS, Neg liver ROS, hiatal hernia,GERD  ,,  Endo/Other  negative endocrine ROSdiabetes    Renal/GU negative Renal ROS  negative genitourinary   Musculoskeletal   Abdominal   Peds  Hematology negative hematology ROS (+)   Anesthesia Other Findings   Reproductive/Obstetrics negative OB ROS                             Anesthesia Physical Anesthesia Plan  ASA: 2  Anesthesia Plan: General   Post-op Pain Management:    Induction:   PONV Risk Score and Plan: Propofol infusion  Airway Management Planned:   Additional Equipment:   Intra-op Plan:   Post-operative Plan:   Informed Consent: I have reviewed the patients History and Physical, chart, labs and discussed the procedure including the risks, benefits and alternatives for the proposed anesthesia with the patient or authorized representative who has indicated his/her understanding and acceptance.     Dental Advisory Given  Plan Discussed with: CRNA  Anesthesia Plan Comments:        Anesthesia Quick Evaluation

## 2023-04-05 LAB — SURGICAL PATHOLOGY

## 2023-04-05 NOTE — Anesthesia Postprocedure Evaluation (Signed)
Anesthesia Post Note  Patient: Stephen Alexander  Procedure(s) Performed: COLONOSCOPY WITH PROPOFOL ESOPHAGOGASTRODUODENOSCOPY (EGD) WITH PROPOFOL BIOPSY POLYPECTOMY  Patient location during evaluation: Phase II Anesthesia Type: General Level of consciousness: awake Pain management: pain level controlled Vital Signs Assessment: post-procedure vital signs reviewed and stable Respiratory status: spontaneous breathing and respiratory function stable Cardiovascular status: blood pressure returned to baseline and stable Postop Assessment: no headache and no apparent nausea or vomiting Anesthetic complications: no Comments: Late entry   No notable events documented.   Last Vitals:  Vitals:   04/04/23 0627 04/04/23 0814  BP: 117/71 108/61  Pulse: 78 77  Resp: 16   Temp: 36.7 C 36.5 C  SpO2: 95% 98%    Last Pain:  Vitals:   04/04/23 0814  TempSrc: Axillary  PainSc:                  Windell [place]

## 2023-04-07 ENCOUNTER — Encounter: Payer: Self-pay | Admitting: Internal Medicine

## 2023-04-07 NOTE — Op Note (Signed)
[facility] Patient Name: Stephen SaleWeldon Romanek Procedure Date: 04/04/2023 7:19 AM MRN: [id number] Date of Birth: 06/25/1966 Attending MD: Gennette Pacobert Michael Cacie Gaskins , MD, [phone] CSN: [id number] Age: [id number] Admit Type: Outpatient Procedure:                Sigmoidoscopy openColonoscopy Indications:              Rectal bleeding. Providers:                Gennette Pacobert Michael Birney Belshe, MD, Sheran FavaJessica Boudreaux,                            Pandora LeiterNeville David, Technician Referring MD:              Medicines:                Propofol per Anesthesia Complications:            No immediate complications. Estimated Blood Loss:     Estimated blood loss was minimal. Procedure:                Pre-Anesthesia Assessment:                           - Prior to the procedure, a History and Physical                            was performed, and patient medications and                            allergies were reviewed. The patient's tolerance of                            previous anesthesia was also reviewed. The risks                            and benefits of the procedure and the sedation                            options and risks were discussed with the patient.                            All questions were answered, and informed consent                            was obtained. Prior Anticoagulants: The patient has                            taken no anticoagulant or antiplatelet agents. ASA                            Grade Assessment: III - A patient with severe                            systemic disease. After reviewing the risks and  benefits, the patient was deemed in satisfactory                            condition to undergo the procedure.                           After obtaining informed consent, the colonoscope                            was passed under direct vision. Throughout the                            procedure, the patient's blood pressure, pulse, and                             oxygen saturations were monitored continuously. The                            [id number]) scope was introduced through                            the anus with the intention of advancing to the                            cecum. The scope was advanced to the sigmoid colon                            before the procedure was aborted. Medications were                            given. Scope In: 7:53:54 AM Scope Out: 8:08:20 AM Scope Withdrawal Time: 0 hours 10 minutes 30 seconds  Total Procedure Duration: 0 hours 14 minutes 26 seconds  Findings:      Non-bleeding internal hemorrhoids were found during retroflexion. The       hemorrhoids were moderate, medium-sized and Grade I (internal       hemorrhoids that do not prolapse).      A 5 mm polyp was found in the descending colon. The polyp was sessile.       The polyp was removed with a cold snare. Resection and retrieval were       complete. Estimated blood loss was minimal.      The exam was otherwise without abnormality on direct and retroflexion       views. Impression:               - Non-bleeding internal hemorrhoids.                           - One 5 mm polyp in the descending colon, removed                            with a cold snare. Resected and retrieved.                           - The examination was otherwise normal on direct  and retroflexion views. Moderate Sedation:      Moderate (conscious) sedation was personally administered by an       anesthesia professional. The following parameters were monitored: oxygen       saturation, heart rate, blood pressure, respiratory rate, EKG, adequacy       of pulmonary ventilation, and response to care. Recommendation:           - Patient has a contact number available for                            emergencies. The signs and symptoms of potential                            delayed complications were discussed with the                             patient. Return to normal activities tomorrow.                            Written discharge instructions were provided to the                            patient.                           - Advance diet as tolerated.                           - Continue present medications.                           - Repeat colonoscopy date to be determined after                            pending pathology results are reviewed for                            surveillance.                           - Return to GI office (date not yet determined). Procedure Code(s):        --- Professional ---                           [phone], 52, Colonoscopy, flexible; with removal of                            tumor(s), polyp(s), or other lesion(s) by snare                            technique Diagnosis Code(s):        --- Professional ---                           Z12.11, Encounter for screening for malignant  neoplasm of colon                           D12.4, Benign neoplasm of descending colon                           K64.0, First degree hemorrhoids CPT copyright 2022 [employer]. All rights reserved. The codes documented in this report are preliminary and upon coder review may  be revised to meet current compliance requirements. Gerrit Friends. Clairessa Boulet, MD Gennette Pac, MD 04/07/2023 12:50:53 PM This report has been signed electronically. Number of Addenda: 0

## 2023-04-12 ENCOUNTER — Encounter (HOSPITAL_COMMUNITY): Payer: Self-pay | Admitting: Internal Medicine

## 2023-07-05 ENCOUNTER — Encounter: Payer: Self-pay | Admitting: Internal Medicine

## 2023-07-05 ENCOUNTER — Ambulatory Visit: Payer: 59 | Admitting: Internal Medicine

## 2023-07-05 VITALS — BP 119/79 | HR 74 | Temp 97.8°F | Ht 70.0 in | Wt 244.2 lb

## 2023-07-05 DIAGNOSIS — K5909 Other constipation: Secondary | ICD-10-CM

## 2023-07-05 DIAGNOSIS — K625 Hemorrhage of anus and rectum: Secondary | ICD-10-CM

## 2023-07-05 DIAGNOSIS — K921 Melena: Secondary | ICD-10-CM

## 2023-07-05 NOTE — Patient Instructions (Signed)
Good to see you again today!  The key to managing hemorrhoids is avoid straining /constipation.  Information on hemorrhoids and constipation provided.  Hemorrhoid banding pamphlet provided  As discussed, I recommend a capful of MiraLAX every day without fail.  Keep track of your symptoms over the past 6 weeks.  Office visit in 6 weeks for possible hemorrhoid banding.

## 2023-07-05 NOTE — Progress Notes (Unsigned)
Primary Care Physician:  [name], MD Primary Gastroenterologist:  Dr. [name]  Pre-Procedure History & Physical: HPI:  Stephen Alexander is a 57 y.o. male here for follow-up EGD colonoscopy.  No H. pylori or tumor on gastric biopsies.  Hyperplastic polyp and colonoscopy internal hemorrhoids.  He continues to have paper hematochezia temporally related to bouts of constipation.  Has trouble remembering to take MiraLAX he may go 3 days without a bowel movement that it.  Then strains.  Then has bleeding. Colonoscopy in 10 years.  We discussed spectrum of treatment for symptomatic hemorrhoids including banding.  The key to management is avoid straining and narrow regular bowel function.   Past Medical History:  Diagnosis Date   Atypical chest pain    history   Back pain    Chronic discogenic    DM (diabetes mellitus) (HCC)    GERD (gastroesophageal reflux disease)    Hiatal hernia 12/28/2011   small   Hyperlipidemia    Hypertension     Past Surgical History:  Procedure Laterality Date   BIOPSY  04/04/2023   Procedure: BIOPSY;  Surgeon: [name], MD;  [facility];  Service: Endoscopy;;   COLONOSCOPY    11/03/2005   Dr. Elmer Ramp rectum, colon, terminal ileum.   COLONOSCOPY  09/08/12   Dr. [name]- friable anal canal o/w normal rectum and colon   COLONOSCOPY WITH PROPOFOL N/A 04/04/2023   Procedure: COLONOSCOPY WITH PROPOFOL;  Surgeon: [name], MD;  [facility];  Service: Endoscopy;  Laterality: N/A;  7:30 am, asa 2   ESOPHAGOGASTRODUODENOSCOPY  09/08/12   Dr. [name]- mild inflammatory changes of the distal esophagus consistent with erosive reflux esophagitits. small hiatal hernia   ESOPHAGOGASTRODUODENOSCOPY (EGD) WITH PROPOFOL N/A 04/04/2023   Procedure: ESOPHAGOGASTRODUODENOSCOPY (EGD) WITH PROPOFOL;  Surgeon: [name], MD;  [facility];  Service: Endoscopy;  Laterality: N/A;   NM MYOCAR PERF WALL MOTION  01/30/2010   normal, EF   51%   POLYPECTOMY  04/04/2023   Procedure: POLYPECTOMY;  Surgeon: [name], MD;  [facility]: [facility];  Service: Endoscopy;;   US ECHOCARDIOGRAPHY  01/30/2010   normal    Prior to Admission medications   Medication Sig Start Date End Date Taking? Authorizing Provider  Cinnamon 500 MG TABS Take 500 mg by mouth daily.   Yes [provider]  Coenzyme Q10 (CO Q-10) 300 MG CAPS Take 300 mg by mouth daily.   Yes [provider]  cyclobenzaprine (FLEXERIL) 10 MG tablet Take 10 mg by mouth 3 (three) times daily as needed.   Yes [provider]  docusate sodium (COLACE) 50 MG capsule Take 50 mg by mouth 2 (two) times daily.   Yes [provider]  esomeprazole (NEXIUM) 20 MG capsule Take 20 mg by mouth daily at 12 noon.   Yes [provider]  Fexofenadine HCl (ALLEGRA PO) Take by mouth daily.   Yes [provider]  HYDROcodone-acetaminophen (NORCO) 10-325 MG per tablet Take 1 tablet by mouth every 6 (six) hours as needed for pain.   Yes [provider]  losartan (COZAAR) 50 MG tablet Take 50 mg by mouth daily.   Yes [provider]  metFORMIN (GLUCOPHAGE) 500 MG tablet Take by mouth 2 (two) times daily with a meal.   Yes [provider]  nebivolol (BYSTOLIC) 5 MG tablet Take 5 mg by mouth daily.   Yes [provider]  OMEGA-3 KRILL  OIL 300 MG CAPS Take by mouth daily.   Yes [provider]  polyethylene glycol (MIRALAX / GLYCOLAX) 17 g packet Take 17 g by mouth daily.   Yes [provider]  Red Yeast Rice 600 MG TABS Take 600 mg by mouth 2 (two) times daily.   Yes [provider]  tirzepatide Greggory Keen) 10 MG/0.5ML Pen Inject 12.5 mg into the skin once a week.   Yes [provider]  zolpidem (AMBIEN) 10 MG tablet Take 10 mg by mouth at bedtime as needed for sleep.   Yes [provider]    Allergies as of 07/05/2023 - Review Complete 07/05/2023  Allergen  Reaction Noted   Amoxicillin Rash 04/04/2023    Family History  Problem Relation Age of Onset   Heart failure Father    Hypertension Father     Social History   Socioeconomic History   Marital status: Single    Spouse name: Not on file   Number of children: Not on file   Years of education: Not on file   Highest education level: Not on file  Occupational History   Not on file  Tobacco Use   Smoking status: Former   Smokeless tobacco: Never   Tobacco comments:    Socially  Substance and Sexual Activity   Alcohol use: No   Drug use: No   Sexual activity: Yes  Other Topics Concern   Not on file  Social History Narrative   Not on file   Social Determinants of Health   Financial Resource Strain: Not on file  Food Insecurity: Not on file  Transportation Needs: Not on file  Physical Activity: Not on file  Stress: Not on file  Social Connections: Not on file  Intimate Partner Violence: Not on file    Review of Systems: See HPI, otherwise negative ROS  Physical Exam: BP 119/79 (BP Location: Right Arm, Patient Position: Sitting, Cuff Size: Large)   Pulse 74   Temp 97.8 F (36.6 C) (Oral)   Ht 5\' 10"  (1.778 m)   Wt 244 lb 3.2 oz (110.8 kg)   SpO2 97%   BMI 35.04 kg/m  General:   Alert,  Well-developed, well-nourished, pleasant and cooperative in NAD  Impression/Plan: 57 year old gentleman with paper hematochezia; hemorrhoids the culprit based on recent colonoscopy.  Only rarely takes opioids.  He forgets to take his MiraLAX and gets backed up which produces a vicious cycle.  We talked about spectrum of treatment for hemorrhoid management.  Would like to see bowel function optimized before deciding on any specific hemorrhoid therapy (if needed)  Recommendations   key to managing hemorrhoids is avoid straining /constipation.  Information on hemorrhoids and constipation provided.  Hemorrhoid banding pamphlet provided  As discussed, I recommend a capful of  MiraLAX every day without fail.  Keep track of  symptoms over the past 6 weeks.  Office visit in 6 weeks for possible hemorrhoid banding.    Notice: This dictation was prepared with Dragon dictation along with smaller phrase technology. Any transcriptional errors that result from this process are unintentional and may not be corrected upon review.

## 2023-08-16 ENCOUNTER — Ambulatory Visit: Payer: 59 | Admitting: Gastroenterology

## 2023-08-16 ENCOUNTER — Encounter: Payer: Self-pay | Admitting: Gastroenterology

## 2023-08-16 VITALS — BP 118/72 | HR 75 | Temp 98.2°F | Ht 70.0 in | Wt 242.4 lb

## 2023-08-16 DIAGNOSIS — K625 Hemorrhage of anus and rectum: Secondary | ICD-10-CM

## 2023-08-16 NOTE — Patient Instructions (Signed)
Continue Miralax daily as you are doing.  Avoid straining, limit toilet time to 2-3 minutes.   Please call if persistent symptoms, and we can do a hemorrhoid banding!  We will see you back as needed!  It was a pleasure to see you today. I want to create trusting relationships with patients and provide genuine, compassionate, and quality care. I truly value your feedback, so please be on the lookout for a survey regarding your visit with me today. I appreciate your time in completing this!         Gelene Mink, PhD, ANP-BC [facility]

## 2023-08-16 NOTE — Progress Notes (Signed)
Gastroenterology Office Note     Primary Care Physician:  [name], MD  Primary Gastroenterologist: Dr. [name]    Chief Complaint   Chief Complaint  Patient presents with   Hemorrhoids    Pt following up on hemorrhoids     History of Present Illness   Stephen Alexander is a 57 y.o. male presenting today with a history of rectal bleeding secondary to internal hemorrhoids exacerbated by constipation. He was recently seen by Dr. [name] in July 2024 and returns today to discuss possible banding.    Colonoscopy 2024: non-bleeding internal hemorrhoids, one 5 mm polyp (hyperplastic).     2 times of bleeding in past 6 weeks. Started taking Miralax daily. BM usually about every day. May miss a day sporadically. No straining. Has been on mounjaro since Jan 2023. No prolapsing tissue, itching, burning. Some minor soreness at times.   Declining rectal exam. Wants to hold off on hemorrhoid banding.     Past Medical History:  Diagnosis Date   Atypical chest pain    history   Back pain    Chronic discogenic    DM (diabetes mellitus) (HCC)    GERD (gastroesophageal reflux disease)    Hiatal hernia 12/28/2011   small   Hyperlipidemia    Hypertension     Past Surgical History:  Procedure Laterality Date   BIOPSY  04/04/2023   Procedure: BIOPSY;  Surgeon: [name], MD;  [facility];  Service: Endoscopy;;   COLONOSCOPY    11/03/2005   Dr. Elmer Ramp rectum, colon, terminal ileum.   COLONOSCOPY  09/08/12   Dr. [name]- friable anal canal o/w normal rectum and colon   COLONOSCOPY WITH PROPOFOL N/A 04/04/2023   Procedure: COLONOSCOPY WITH PROPOFOL;  Surgeon: [name], MD;  [facility];  Service: Endoscopy;  Laterality: N/A;  7:30 am, asa 2   ESOPHAGOGASTRODUODENOSCOPY  09/08/12   Dr. [name]- mild inflammatory changes of the distal esophagus consistent with erosive reflux esophagitits. small hiatal hernia   ESOPHAGOGASTRODUODENOSCOPY (EGD)  WITH PROPOFOL N/A 04/04/2023   Procedure: ESOPHAGOGASTRODUODENOSCOPY (EGD) WITH PROPOFOL;  Surgeon: [name], MD;  [facility];  Service: Endoscopy;  Laterality: N/A;   NM MYOCAR PERF WALL MOTION  01/30/2010   normal, EF  51%   POLYPECTOMY  04/04/2023   Procedure: POLYPECTOMY;  Surgeon: [name], MD;  [facility];  Service: Endoscopy;;   US ECHOCARDIOGRAPHY  01/30/2010   normal    Current Outpatient Medications  Medication Sig Dispense Refill   Cinnamon 500 MG TABS Take 500 mg by mouth daily.     Coenzyme Q10 (CO Q-10) 300 MG CAPS Take 300 mg by mouth daily.     cyclobenzaprine (FLEXERIL) 10 MG tablet Take 10 mg by mouth 3 (three) times daily as needed.     docusate sodium (COLACE) 50 MG capsule Take 50 mg by mouth 2 (two) times daily.     esomeprazole (NEXIUM) 20 MG capsule Take 20 mg by mouth daily at 12 noon.     Fexofenadine HCl (ALLEGRA PO) Take by mouth daily.     HYDROcodone-acetaminophen (NORCO) 10-325 MG per tablet Take 1 tablet by mouth every 6 (six) hours as needed for pain.     losartan (COZAAR) 50 MG tablet Take 50 mg by mouth daily.     metFORMIN (GLUCOPHAGE) 500 MG tablet Take by mouth 2 (two) times daily with a meal.  nebivolol (BYSTOLIC) 5 MG tablet Take 5 mg by mouth daily.     OMEGA-3 KRILL OIL 300 MG CAPS Take by mouth daily.     polyethylene glycol (MIRALAX / GLYCOLAX) 17 g packet Take 17 g by mouth daily.     Red Yeast Rice 600 MG TABS Take 600 mg by mouth 2 (two) times daily.     tirzepatide (MOUNJARO) 10 MG/0.5ML Pen Inject 12.5 mg into the skin once a week.     zolpidem (AMBIEN) 10 MG tablet Take 10 mg by mouth at bedtime as needed for sleep.     No current facility-administered medications for this visit.    Allergies as of 08/16/2023 - Review Complete 08/16/2023  Allergen Reaction Noted   Amoxicillin Rash 04/04/2023    Family History  Problem Relation Age of Onset   Heart failure Father    Hypertension Father      Social History   Socioeconomic History   Marital status: Single    Spouse name: Not on file   Number of children: Not on file   Years of education: Not on file   Highest education level: Not on file  Occupational History   Not on file  Tobacco Use   Smoking status: Former   Smokeless tobacco: Never   Tobacco comments:    Socially  Substance and Sexual Activity   Alcohol use: No   Drug use: No   Sexual activity: Yes  Other Topics Concern   Not on file  Social History Narrative   Not on file   Social Determinants of Health   Financial Resource Strain: Not on file  Food Insecurity: Not on file  Transportation Needs: Not on file  Physical Activity: Not on file  Stress: Not on file  Social Connections: Unknown (05/10/2022)   Received from [facility]   Social Network    Social Network: Not on file  Intimate Partner Violence: Unknown (04/01/2022)   Received from [facility]   HITS    Physically Hurt: Not on file    Insult or Talk Down To: Not on file    Threaten Physical Harm: Not on file    Scream or Curse: Not on file     Review of Systems   Gen: Denies any fever, chills, fatigue, weight loss, lack of appetite.  CV: Denies chest pain, heart palpitations, peripheral edema, syncope.  Resp: Denies shortness of breath at rest or with exertion. Denies wheezing or cough.  GI: Denies dysphagia or odynophagia. Denies jaundice, hematemesis, fecal incontinence. GU : Denies urinary burning, urinary frequency, urinary hesitancy MS: Denies joint pain, muscle weakness, cramps, or limitation of movement.  Derm: Denies rash, itching, dry skin Psych: Denies depression, anxiety, memory loss, and confusion Heme: Denies bruising, bleeding, and enlarged lymph nodes.   Physical Exam   BP 118/72   Pulse 75   Temp 98.2 F (36.8 C)   Ht 5\' 10"  (1.778 m)   Wt 242 lb 6.4 oz (110 kg)   BMI 34.78 kg/m  General:   Alert and oriented. Pleasant and cooperative. Well-nourished  and well-developed.  Head:  Normocephalic and atraumatic. Eyes:  Without icterus Abdomen:  +BS, soft, non-tender and non-distended. No HSM noted. No guarding or rebound. No masses appreciated.  Rectal:  Declined Msk:  Symmetrical without gross deformities. Normal posture. Extremities:  Without edema. Neurologic:  Alert and  oriented x4;  grossly normal neurologically. Skin:  Intact without significant lesions or rashes. Psych:  Alert and cooperative. Normal  mood and affect.   Assessment   Stephen Alexander is a 57 y.o. male presenting today with a history of rectal bleeding due to internal hemorrhoids exacerbated by constipation. Colonoscopy recently on file.  He has had improvement with Miralax and behavior modifications. He desires to hold off on banding at this moment; however, he would be an excelllent candidate.    PLAN    Continue Miralax daily prn Avoid straining, limit toilet time to 2-3 minutes Call if persistent issues; he would benefit from hemorrhoid banding Return prn    Gelene Mink, PhD, [facility]

## 2023-12-05 ENCOUNTER — Encounter: Payer: Self-pay | Admitting: Internal Medicine

## 2024-09-25 ENCOUNTER — Ambulatory Visit: Admitting: Internal Medicine

## 2024-09-25 ENCOUNTER — Encounter: Payer: Self-pay | Admitting: Internal Medicine

## 2024-09-25 VITALS — BP 126/72 | HR 73 | Temp 97.5°F | Ht 70.0 in | Wt 254.0 lb

## 2024-09-25 DIAGNOSIS — K5900 Constipation, unspecified: Secondary | ICD-10-CM

## 2024-09-25 DIAGNOSIS — K219 Gastro-esophageal reflux disease without esophagitis: Secondary | ICD-10-CM | POA: Diagnosis not present

## 2024-09-25 DIAGNOSIS — Z860100 Personal history of colon polyps, unspecified: Secondary | ICD-10-CM

## 2024-09-25 NOTE — Patient Instructions (Signed)
 Good to see you again today  Continue Nexium 20 mg daily before noon meal  Continue MiraLAX 17 g orally daily  Continue Colace (stool softener) daily as needed  Screening colonoscopy in 9 years  Office visit in 1 year  If you have any interim problems arise please do not hesitate to reach out.

## 2024-09-25 NOTE — Progress Notes (Signed)
 Gastroenterology Progress Note    Primary Care Physician:  Marvine Rush, MD Primary Gastroenterologist:  Dr. Shaaron  Pre-Procedure History & Physical: HPI:  Stephen Alexander is a 58 y.o. male here for follow-up of GERD rectal bleeding and constipation.  Rectal bleeding with MiraLAX and stool softener hyperplastic polyp removed in his colon last year due for surveillance 9 years from now  GERD well-controlled on Nexium 20 mg before noon.  Midday administration is better for him given his work schedule.  No dysphagia.  Normal-appearing esophagus on prior EGD.  Overall states he is doing Good at this time.  Past Medical History:  Diagnosis Date   Atypical chest pain    history   Back pain    Chronic discogenic    DM (diabetes mellitus) (HCC)    GERD (gastroesophageal reflux disease)    Hiatal hernia 12/28/2011   small   Hyperlipidemia    Hypertension     Past Surgical History:  Procedure Laterality Date   BIOPSY  04/04/2023   Procedure: BIOPSY;  Surgeon: Shaaron Lamar HERO, MD;  Location: [facility];  Service: Endoscopy;;   COLONOSCOPY    11/03/2005   Dr. Tisha rectum, colon, terminal ileum.   COLONOSCOPY  09/08/12   Dr. Shaaron- friable anal canal o/w normal rectum and colon   COLONOSCOPY WITH PROPOFOL  N/A 04/04/2023   Procedure: COLONOSCOPY WITH PROPOFOL ;  Surgeon: Shaaron Lamar HERO, MD;  Location: [facility];  Service: Endoscopy;  Laterality: N/A;  7:30 am, asa 2   ESOPHAGOGASTRODUODENOSCOPY  09/08/12   Dr. Shaaron- mild inflammatory changes of the distal esophagus consistent with erosive reflux esophagitits. small hiatal hernia   ESOPHAGOGASTRODUODENOSCOPY (EGD) WITH PROPOFOL  N/A 04/04/2023   Procedure: ESOPHAGOGASTRODUODENOSCOPY (EGD) WITH PROPOFOL ;  Surgeon: Shaaron Lamar HERO, MD;  Location: [facility];  Service: Endoscopy;  Laterality: N/A;   NM MYOCAR PERF WALL MOTION  01/30/2010   normal, EF  51%   POLYPECTOMY  04/04/2023   Procedure: POLYPECTOMY;  Surgeon: Shaaron Lamar HERO, MD;  Location: [facility];  Service: Endoscopy;;   US  ECHOCARDIOGRAPHY  01/30/2010   normal    Prior to Admission medications   Medication Sig Start Date End Date Taking? Authorizing Provider  acetaminophen (TYLENOL) 500 MG tablet Take 500 mg by mouth every 6 (six) hours as needed.   Yes [provider]  Cinnamon 500 MG TABS Take 500 mg by mouth daily.   Yes [provider]  Coenzyme Q10 (CO Q-10) 300 MG CAPS Take 300 mg by mouth daily.   Yes [provider]  cyclobenzaprine (FLEXERIL) 10 MG tablet Take 10 mg by mouth 3 (three) times daily as needed.   Yes [provider]  docusate sodium (COLACE) 50 MG capsule Take 50 mg by mouth 2 (two) times daily.   Yes [provider]  esomeprazole (NEXIUM) 20 MG capsule Take 20 mg by mouth daily at 12 noon.   Yes [provider]  Fexofenadine HCl (ALLEGRA PO) Take by mouth daily.   Yes [provider]  HYDROcodone-acetaminophen (NORCO) 10-325 MG per tablet Take 1 tablet by mouth every 6 (six) hours as needed for pain.   Yes [provider]  ibuprofen (ADVIL) 200 MG tablet Take 200 mg by mouth every 6 (six) hours as needed.   Yes [provider]  losartan (COZAAR) 50 MG tablet Take 50 mg by mouth daily.   Yes [provider]  metFORMIN (GLUCOPHAGE) 500 MG tablet Take by  mouth 2 (two) times daily with a meal.   Yes [provider]  nebivolol (BYSTOLIC) 5 MG tablet Take 5 mg by mouth daily.   Yes [provider]  OMEGA-3 KRILL OIL 300 MG CAPS Take by mouth daily.   Yes [provider]  polyethylene glycol (MIRALAX / GLYCOLAX) 17 g packet Take 17 g by mouth daily.   Yes [provider]  Psyllium (METAMUCIL PO) Take by mouth.   Yes [provider]  tirzepatide CLOYDE) 10 MG/0.5ML Pen Inject 10 mg into the skin once a week.   Yes [provider]  zolpidem (AMBIEN) 10 MG tablet Take 10 mg by mouth at  bedtime as needed for sleep.   Yes [provider]    Allergies as of 09/25/2024 - Review Complete 09/25/2024  Allergen Reaction Noted   Amoxicillin Rash 04/04/2023    Family History  Problem Relation Age of Onset   Heart failure Father    Hypertension Father     Social History   Socioeconomic History   Marital status: Single    Spouse name: Not on file   Number of children: Not on file   Years of education: Not on file   Highest education level: Not on file  Occupational History   Not on file  Tobacco Use   Smoking status: Former   Smokeless tobacco: Never   Tobacco comments:    Socially  Substance and Sexual Activity   Alcohol use: No   Drug use: No   Sexual activity: Yes  Other Topics Concern   Not on file  Social History Narrative   Not on file   Social Drivers of Health   Financial Resource Strain: Not on file  Food Insecurity: Not on file  Transportation Needs: Not on file  Physical Activity: Not on file  Stress: Not on file  Social Connections: Unknown (05/10/2022)   Received from [facility]   Social Network    Social Network: Not on file  Intimate Partner Violence: Unknown (04/01/2022)   Received from [facility]   HITS    Physically Hurt: Not on file    Insult or Talk Down To: Not on file    Threaten Physical Harm: Not on file    Scream or Curse: Not on file    Review of Systems   See HPI, otherwise negative ROS  Physical Exam: BP 126/72 (BP Location: Left Arm, Patient Position: Sitting, Cuff Size: Large)   Pulse 73   Temp (!) 97.5 F (36.4 C) (Oral)   Ht 5' 10 (1.778 m)   Wt 254 lb (115.2 kg)   SpO2 96%   BMI 36.45 kg/m  General:   Alert,  Well-developed, well-nourished, pleasant and cooperative in NAD Abdomen: Non-distended, normal bowel sounds.  Soft and nontender without appreciable mass or hepatosplenomegaly.  Pulses:  Normal pulses noted. Extremities:  Without clubbing or edema.   Impression/Plan:    Pleasant  58 year old gentleman with uncomplicated GERD.  Managed well with low-dose PPI once daily.  Constipation now nicely managed with MiraLAX and stool softener daily.  Colonoscopy EGD findings as outlined above  Recommendations:   Continue Nexium 20 mg daily before noon meal  Continue MiraLAX 17 g orally daily  Continue Colace (stool softener) daily as needed  Screening colonoscopy in 9 years  Office visit in 1 year  If you have any interim problems arise please do not hesitate to reach out.   Notice: This dictation was prepared with [place]  dictation along with smaller phrase technology. Any transcriptional errors that result from this process are unintentional and may not be corrected upon review.
